# Patient Record
Sex: Female | Born: 1955 | Race: White | Hispanic: No | Marital: Married | Smoking: Never smoker
Health system: Southern US, Community
[De-identification: ages and names within clinical notes are randomized; demographics above are authoritative.]

## PROBLEM LIST (undated history)

## (undated) DIAGNOSIS — E669 Obesity, unspecified: Secondary | ICD-10-CM

## (undated) DIAGNOSIS — Z91018 Allergy to other foods: Secondary | ICD-10-CM

## (undated) DIAGNOSIS — R600 Localized edema: Secondary | ICD-10-CM

## (undated) DIAGNOSIS — K219 Gastro-esophageal reflux disease without esophagitis: Secondary | ICD-10-CM

## (undated) DIAGNOSIS — I10 Essential (primary) hypertension: Secondary | ICD-10-CM

## (undated) DIAGNOSIS — E785 Hyperlipidemia, unspecified: Secondary | ICD-10-CM

## (undated) DIAGNOSIS — K829 Disease of gallbladder, unspecified: Secondary | ICD-10-CM

## (undated) DIAGNOSIS — M2550 Pain in unspecified joint: Secondary | ICD-10-CM

## (undated) HISTORY — PX: APPENDECTOMY: SHX54

## (undated) HISTORY — PX: HERNIA REPAIR: SHX51

## (undated) HISTORY — DX: Disease of gallbladder, unspecified: K82.9

## (undated) HISTORY — DX: Localized edema: R60.0

## (undated) HISTORY — PX: LAPAROSCOPIC CHOLECYSTECTOMY: SUR755

## (undated) HISTORY — DX: Essential (primary) hypertension: I10

## (undated) HISTORY — DX: Hyperlipidemia, unspecified: E78.5

## (undated) HISTORY — DX: Gastro-esophageal reflux disease without esophagitis: K21.9

## (undated) HISTORY — DX: Allergy to other foods: Z91.018

## (undated) HISTORY — PX: POSTERIOR TIBIAL TENDON REPAIR: SHX6039

## (undated) HISTORY — DX: Obesity, unspecified: E66.9

## (undated) HISTORY — DX: Pain in unspecified joint: M25.50

---

## 1981-10-21 HISTORY — PX: TONSILECTOMY, ADENOIDECTOMY, BILATERAL MYRINGOTOMY AND TUBES: SHX2538

## 2000-05-16 ENCOUNTER — Encounter: Admission: RE | Admit: 2000-05-16 | Discharge: 2000-05-16 | Payer: Self-pay | Admitting: Obstetrics and Gynecology

## 2000-05-16 ENCOUNTER — Encounter: Payer: Self-pay | Admitting: Obstetrics and Gynecology

## 2001-05-21 ENCOUNTER — Encounter: Admission: RE | Admit: 2001-05-21 | Discharge: 2001-05-21 | Payer: Self-pay | Admitting: Family Medicine

## 2001-05-21 ENCOUNTER — Encounter: Payer: Self-pay | Admitting: Family Medicine

## 2002-05-28 ENCOUNTER — Encounter: Admission: RE | Admit: 2002-05-28 | Discharge: 2002-05-28 | Payer: Self-pay | Admitting: Obstetrics and Gynecology

## 2002-05-28 ENCOUNTER — Encounter: Payer: Self-pay | Admitting: Obstetrics and Gynecology

## 2003-06-14 ENCOUNTER — Encounter: Payer: Self-pay | Admitting: Obstetrics and Gynecology

## 2003-06-14 ENCOUNTER — Encounter: Admission: RE | Admit: 2003-06-14 | Discharge: 2003-06-14 | Payer: Self-pay | Admitting: Obstetrics and Gynecology

## 2004-06-21 ENCOUNTER — Encounter: Admission: RE | Admit: 2004-06-21 | Discharge: 2004-06-21 | Payer: Self-pay | Admitting: Obstetrics and Gynecology

## 2004-06-28 ENCOUNTER — Encounter: Admission: RE | Admit: 2004-06-28 | Discharge: 2004-06-28 | Payer: Self-pay | Admitting: Obstetrics and Gynecology

## 2004-12-21 ENCOUNTER — Encounter: Admission: RE | Admit: 2004-12-21 | Discharge: 2004-12-21 | Payer: Self-pay | Admitting: Obstetrics and Gynecology

## 2005-06-25 ENCOUNTER — Encounter: Admission: RE | Admit: 2005-06-25 | Discharge: 2005-06-25 | Payer: Self-pay | Admitting: Obstetrics and Gynecology

## 2005-09-15 ENCOUNTER — Emergency Department (HOSPITAL_COMMUNITY): Admission: EM | Admit: 2005-09-15 | Discharge: 2005-09-15 | Payer: Self-pay | Admitting: Emergency Medicine

## 2006-02-26 ENCOUNTER — Ambulatory Visit (HOSPITAL_COMMUNITY): Admission: RE | Admit: 2006-02-26 | Discharge: 2006-02-26 | Payer: Self-pay | Admitting: Neurosurgery

## 2006-07-08 ENCOUNTER — Encounter: Admission: RE | Admit: 2006-07-08 | Discharge: 2006-07-08 | Payer: Self-pay | Admitting: Obstetrics and Gynecology

## 2007-05-29 ENCOUNTER — Ambulatory Visit (HOSPITAL_COMMUNITY): Admission: RE | Admit: 2007-05-29 | Discharge: 2007-05-29 | Payer: Self-pay | Admitting: Family Medicine

## 2007-07-13 ENCOUNTER — Encounter: Admission: RE | Admit: 2007-07-13 | Discharge: 2007-07-13 | Payer: Self-pay | Admitting: Obstetrics and Gynecology

## 2007-07-20 ENCOUNTER — Ambulatory Visit (HOSPITAL_COMMUNITY): Admission: RE | Admit: 2007-07-20 | Discharge: 2007-07-20 | Payer: Self-pay | Admitting: Family Medicine

## 2007-10-12 ENCOUNTER — Inpatient Hospital Stay (HOSPITAL_COMMUNITY): Admission: EM | Admit: 2007-10-12 | Discharge: 2007-10-14 | Payer: Self-pay | Admitting: Emergency Medicine

## 2007-10-13 ENCOUNTER — Ambulatory Visit (HOSPITAL_COMMUNITY): Admission: RE | Admit: 2007-10-13 | Discharge: 2007-10-13 | Payer: Self-pay | Admitting: Family Medicine

## 2008-05-24 ENCOUNTER — Ambulatory Visit (HOSPITAL_COMMUNITY): Admission: RE | Admit: 2008-05-24 | Discharge: 2008-05-24 | Payer: Self-pay | Admitting: Neurosurgery

## 2008-06-15 ENCOUNTER — Encounter: Admission: RE | Admit: 2008-06-15 | Discharge: 2008-07-06 | Payer: Self-pay | Admitting: Orthopedic Surgery

## 2008-08-05 ENCOUNTER — Ambulatory Visit (HOSPITAL_BASED_OUTPATIENT_CLINIC_OR_DEPARTMENT_OTHER): Admission: RE | Admit: 2008-08-05 | Discharge: 2008-08-05 | Payer: Self-pay | Admitting: Obstetrics and Gynecology

## 2009-08-17 ENCOUNTER — Encounter: Admission: RE | Admit: 2009-08-17 | Discharge: 2009-08-17 | Payer: Self-pay | Admitting: Obstetrics and Gynecology

## 2009-08-24 ENCOUNTER — Ambulatory Visit (HOSPITAL_COMMUNITY): Admission: RE | Admit: 2009-08-24 | Discharge: 2009-08-24 | Payer: Self-pay | Admitting: Neurology

## 2009-09-18 ENCOUNTER — Encounter: Admission: RE | Admit: 2009-09-18 | Discharge: 2009-09-26 | Payer: Self-pay | Admitting: Orthopedic Surgery

## 2009-09-27 ENCOUNTER — Ambulatory Visit (HOSPITAL_BASED_OUTPATIENT_CLINIC_OR_DEPARTMENT_OTHER): Admission: RE | Admit: 2009-09-27 | Discharge: 2009-09-28 | Payer: Self-pay | Admitting: Orthopedic Surgery

## 2009-11-08 ENCOUNTER — Encounter: Admission: RE | Admit: 2009-11-08 | Discharge: 2010-02-02 | Payer: Self-pay | Admitting: Orthopedic Surgery

## 2009-12-12 ENCOUNTER — Emergency Department (HOSPITAL_COMMUNITY): Admission: EM | Admit: 2009-12-12 | Discharge: 2009-12-12 | Payer: Self-pay | Admitting: Emergency Medicine

## 2010-07-03 ENCOUNTER — Ambulatory Visit: Payer: Self-pay | Admitting: Family Medicine

## 2010-08-21 ENCOUNTER — Encounter: Admission: RE | Admit: 2010-08-21 | Discharge: 2010-08-21 | Payer: Self-pay | Admitting: Obstetrics and Gynecology

## 2010-11-11 ENCOUNTER — Encounter: Payer: Self-pay | Admitting: Obstetrics and Gynecology

## 2010-11-11 ENCOUNTER — Encounter: Payer: Self-pay | Admitting: Family Medicine

## 2011-01-09 LAB — POCT RAPID STREP A (OFFICE): Streptococcus, Group A Screen (Direct): NEGATIVE

## 2011-01-22 LAB — BASIC METABOLIC PANEL
BUN: 11 mg/dL (ref 6–23)
Chloride: 106 mEq/L (ref 96–112)
Glucose, Bld: 100 mg/dL — ABNORMAL HIGH (ref 70–99)
Potassium: 4 mEq/L (ref 3.5–5.1)

## 2011-03-05 NOTE — Op Note (Signed)
Andrea [name], [name]                 ACCOUNT NO.:  [id number]   MEDICAL RECORD NO.:  [id number]          PATIENT TYPE:  INP   LOCATION:  5034                         FACILITY:  [facility]   PHYSICIAN:  Lennie Muckle, MD      DATE OF BIRTH:  Nov 04, 1955   DATE OF PROCEDURE:  10/13/2007  DATE OF DISCHARGE:                               OPERATIVE REPORT   DIAGNOSIS:  Incisional hernia.   PROCEDURE:  Laparoscopic incisional hernia repair.   SURGEON:  Lennie Muckle, MD   ASSISTANT:  Drue Dun, M.D.   ANESTHESIA:  General endotracheal anesthesia.   No immediate complications.  No drains were placed.  A 15 x 20 piece of  dual mesh was placed in the abdominal cavity.   ESTIMATED BLOOD LOSS:  Less than 25 mL.   INDICATIONS FOR PROCEDURE:  Andrea [name] is a 55 year old female who came  to the emergency room last night due to abdominal pain and irretractible  nausea and vomiting.  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  She  did have a previous appendectomy, which the hernia site was directly  over the incisional scar.  Thus, I believed it to be incisional hernia.  Due to her pain, and nausea and vomiting, and the findings of the  intestines within the hernia, discussed with the patient to perform a  hernia repair at the time of her hospitalization.   DETAILS OF PROCEDURE:  Andrea [name] was identified in the preoperative  holding suite.  She was given 2 gm of Kefzol, taken to the operating  room.  Once in the operating room she was placed in a supine position.  After administration of general endotracheal anesthesia, a Foley  catheter was placed.  Her abdomen was prepped and draped in the usual  sterile fashion.  Time out procedure indicated the patient and procedure  were performed.   Using a Veress needle, pneumoperitoneum was able to be obtained.  After  the pneumo instillation, a 5-mm trocar was placed just to the  left of  the umbilicus.  Using the Optiview, the camera was placed into the  abdominal cavity.  There was no evidence of injury with the Veress  needle or the trocar.  An additional 5-mm trocar was placed in the left  upper quadrant.  There were noted multiple adhesions, one in the midline  with omentum, and multiple adhesions in the right lower quadrant with  loops of intestine adhered to the abdominal wall.  Using laparoscopic  scissors, the adhesions were taken down sharply.  Care was to avoid  injuring the intestines.  I was able to visualize a hernia defect within  the abdominal wall in the right lower quadrant.  After removing the  intestinal adhesions, there was a small amount of epiploic fat stuck  within a second smaller hernia site.  This was able to be fully reduced.   The hernia defect size was noted to be approximately 5 cm.  Putting 4 cm  on  either side, I elected to place the 15 x 20 piece of dual mesh.  The  Parietex mesh was marked with 2-0 Prolenes on 4 sides.  It was then  moistened and placed inside the abdominal cavity.  The mesh was then  secured to the abdominal wall with adequate tension.  Using the Pro  tacker device it was then secured all the way around to the peritoneum.  Five additional pieces of Gore-Tex suture were used to fully secure the  mesh to the abdominal wall.  An additional 5-mm trocar was placed in the  left lower quadrant.  The 11-mm trocar was closed with 0 Vicryl suture.  Skin was closed with 4-0 Monocryl.  Steri-Strips were placed for final  dressing over the stab incisions for the suture, and as well, over the  trocar sites.   The patient was then extubated and transported to the post anesthesia  care unit in stable condition.      Lennie Muckle, MD  Electronically Signed     ALA/MEDQ  D:  10/13/2007  T:  10/14/2007  Job:  [id number]

## 2011-03-05 NOTE — Consult Note (Signed)
Andrea [name], Andrea [name]                 ACCOUNT NO.:  [id number]   MEDICAL RECORD NO.:  [id number]           PATIENT TYPE:   LOCATION:                                 FACILITY:   PHYSICIAN:  Clovis Pu. Cornett, M.D.DATE OF BIRTH:  1956/09/21   DATE OF CONSULTATION:  DATE OF DISCHARGE:                                 CONSULTATION   PHYSICIAN REQUESTING CONSULTATION:  Dr. Mariel Aloe.   REASON FOR CONSULTATION:  Nausea, vomiting, abdominal pain.   HISTORY OF PRESENT ILLNESS:  The patient is a 55 year old female with a  33-month history of intermittent epigastric pain, nausea and vomiting.  She has been worked up by her primary care doctor, and actually has seen  Dr. 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  She has had intermittent nausea, vomiting, and  epigastric pain off and on over the last 2-88-months.  Last week, she  developed an increase in her nausea, vomiting and epigastric pain.  This  let off over the weekend, and it increased again today.  Her chief  complaint was epigastric pain with nausea/vomiting.  Her bowel function  has been relatively normal.  Her pain became quite severe.  She sought  care in the emergency room.  CT scan revealed the right lower quadrant  spigelian hernia with part of what appeared to be the ascending colon up  into it.  There is no free fluid or swelling that I could see.  There  was some dilatation of the small bowel consistent with partial small-  bowel obstruction.  Currently, her pain is gone.  She received some  Dilaudid earlier.  She denies any abdominal pain currently, and is  having no nausea or vomiting currently.  When the pain did occur, it  actually occurred in her epigastrium.  She has never had any significant  right lower quadrant pain today to speak of she states.  No blood in her  stool.   PAST MEDICAL HISTORY:  1.  Essential hypertension.  2. History of diverticulitis.   PAST SURGICAL HISTORY:  Laparoscopic cholecystectomy.   MEDICATIONS:  Cymbalta, Diovan, Levsin, Nexium.   ALLERGIES:  None.   FAMILY HISTORY:  Noncontributory.   REVIEW OF SYSTEMS:  15-point review of systems as above, otherwise  negative x15.   SOCIAL HISTORY:  Denies tobacco or alcohol use.  She is married.  She is  a Engineer, civil (consulting).   PHYSICAL EXAM:  Temperature 97, pulse 88, blood pressure 100/70.  GENERAL APPEARANCE:  Pleasant female in no apparent distress.  HEENT:  Extraocular movements are intact.  No evidence of scleral  icterus.  NECK:  Supple, nontender.  Trachea midline.  No mass.  PULMONARY:  Lungs are clear to auscultation.  CHEST WALL:  Motion normal.  CARDIOVASCULAR:  Regular rate and rhythm without rub, murmur or gallop.  ABDOMEN is soft, nontender without rebound, guarding.  Right lower  quadrant hernia can be palpated.  It is nontender to deep palpation.  It  is not hard and feels reducible currently.  EXTREMITIES:  Muscle tone normal.  Range of motion normal.  NEUROLOGICAL EXAMINATION:  Motor and sensory function grossly intact.  Glasgow coma scale is 15.   DIAGNOSTIC STUDIES:  I reviewed her CT scan of the abdomen and pelvis.  Right lower quadrant spigelian hernia with what appears to be ascending  colon within it.  No free fluid or obvious signs of free air.  She has a  white count of 11,000.  No left shift.  Urinalysis shows 80+ ketones.  Hemoglobin is 13.  Sodium 137, potassium 2.8, chloride 103, CO2 22, BUN  8, creatinine 0.6, glucose 116.   IMPRESSION:  1. Spigelian hernia, symptomatic.  Currently no signs of incarceration      by physical examination, and probable reduction with physical exam.  2. Hypokalemia.   PLAN:  We will admit for IV fluids and replacement of potassium.  She  will need to have this repaired since she is having more symptoms from  this, and we will make arrangements for this  to be done by the doctor of  the week, who is Dr. Bertram Savin.  I have talked to the patient about  this, and this can be approached laparoscopically.  She understands the  above and agrees to be admitted for hydration and correction of her  potassium prior to surgery.      Thomas A. Cornett, M.D.  Electronically Signed     TAC/MEDQ  D:  10/12/2007  T:  10/13/2007  Job:  [id number]   cc:   Sheppard Penton. Stacie Acres, M.D.

## 2011-03-05 NOTE — Discharge Summary (Signed)
Andrea [name], ZAKRZEWSKI                 ACCOUNT NO.:  [id number]   MEDICAL RECORD NO.:  [id number]          PATIENT TYPE:  INP   LOCATION:  5034                         FACILITY:  [facility]   PHYSICIAN:  Lennie Muckle, MD      DATE OF BIRTH:  03-Dec-1955   DATE OF ADMISSION:  10/12/2007  DATE OF DISCHARGE:  10/14/2007                               DISCHARGE SUMMARY   Final/discharge diagnosis:  Incisional hernia.   PROCEDURE:  Laparoscopic incisional hernia repair on October 13, 2007.   HOSPITAL COURSE:  Mrs. Vernet was admitted on October 12, 2007 due to  abdominal pain and nausea and vomiting.  CT scan revealed a hernia in  the right lower quadrant.  She had a previous appendectomy and more than  likely it was related to this.  Due to her recurrent symptoms and her  nausea and vomiting, I elected to perform a repair during her  hospitalization.  She was taken to the operating room on October 13, 2007 and had an uneventful laparoscopic incisional hernia repair.  Her  pain postoperatively is well-controlled with Percocet, she is tolerating  a regular diet.   DISCHARGE INSTRUCTIONS:  She is discharged with instructions to perform  no heavy lifting greater than 20 pounds for 4 weeks.  She will follow-up  with Dr. Freida Busman in approximately 2-3 weeks.  She will return to work when  she feels better, more than likely return next Monday.   DISCHARGE MEDICATIONS INCLUDE:  1. Percocet 5/325 mg 1-2 every 4 hours p.r.n. for pain.  2. She may take Tylenol instead of the Percocet, and may take over-the-      counter ibuprofen to aid with her pain as well.      Lennie Muckle, MD  Electronically Signed     ALA/MEDQ  D:  10/14/2007  T:  10/14/2007  Job:  [id number]   cc:   Maisie Fus A. Cornett, M.D.

## 2011-07-26 LAB — COMPREHENSIVE METABOLIC PANEL
ALT: 26
Albumin: 3 — ABNORMAL LOW
Albumin: 4
Alkaline Phosphatase: 53
BUN: 4 — ABNORMAL LOW
Calcium: 9.6
Chloride: 103
Glucose, Bld: 116 — ABNORMAL HIGH
Glucose, Bld: 91
Potassium: 2.6 — CL
Sodium: 137
Total Bilirubin: 0.9
Total Protein: 7.2

## 2011-07-26 LAB — CBC
HCT: 33.5 — ABNORMAL LOW
HCT: 40.1
Hemoglobin: 11.4 — ABNORMAL LOW
Hemoglobin: 13.6
MCHC: 34
MCV: 88.1
RBC: 3.8 — ABNORMAL LOW
RDW: 13.5
WBC: 8.5

## 2011-07-26 LAB — URINALYSIS, ROUTINE W REFLEX MICROSCOPIC
Glucose, UA: NEGATIVE
pH: 6.5

## 2011-07-26 LAB — DIFFERENTIAL
Eosinophils Absolute: 0.1 — ABNORMAL LOW
Lymphs Abs: 2.6
Monocytes Relative: 7
Neutro Abs: 7.4
Neutrophils Relative %: 67

## 2011-07-26 LAB — POTASSIUM: Potassium: 3.4 — ABNORMAL LOW

## 2011-08-02 ENCOUNTER — Other Ambulatory Visit: Payer: Self-pay | Admitting: Physician Assistant

## 2011-08-02 DIAGNOSIS — Z1231 Encounter for screening mammogram for malignant neoplasm of breast: Secondary | ICD-10-CM

## 2011-08-13 ENCOUNTER — Other Ambulatory Visit: Payer: Self-pay | Admitting: Physician Assistant

## 2011-08-13 ENCOUNTER — Other Ambulatory Visit (HOSPITAL_COMMUNITY)
Admission: RE | Admit: 2011-08-13 | Discharge: 2011-08-13 | Disposition: A | Discharge status: Home / Self Care | Payer: Commercial Managed Care - PPO | Source: Ambulatory Visit | Attending: Family Medicine | Admitting: Family Medicine

## 2011-08-13 DIAGNOSIS — Z01419 Encounter for gynecological examination (general) (routine) without abnormal findings: Secondary | ICD-10-CM | POA: Insufficient documentation

## 2011-08-28 ENCOUNTER — Ambulatory Visit
Admission: RE | Admit: 2011-08-28 | Discharge: 2011-08-28 | Disposition: A | Discharge status: Home / Self Care | Payer: Commercial Managed Care - PPO | Source: Ambulatory Visit | Attending: Physician Assistant | Admitting: Physician Assistant

## 2011-08-28 DIAGNOSIS — Z1231 Encounter for screening mammogram for malignant neoplasm of breast: Secondary | ICD-10-CM

## 2012-08-21 ENCOUNTER — Other Ambulatory Visit: Payer: Self-pay | Admitting: Physician Assistant

## 2012-08-21 DIAGNOSIS — Z1231 Encounter for screening mammogram for malignant neoplasm of breast: Secondary | ICD-10-CM

## 2012-09-21 ENCOUNTER — Other Ambulatory Visit (HOSPITAL_COMMUNITY)
Admission: RE | Admit: 2012-09-21 | Discharge: 2012-09-21 | Disposition: A | Discharge status: Home / Self Care | Payer: 59 | Source: Ambulatory Visit | Attending: Family Medicine | Admitting: Family Medicine

## 2012-09-21 ENCOUNTER — Other Ambulatory Visit: Payer: Self-pay | Admitting: Physician Assistant

## 2012-09-21 DIAGNOSIS — Z124 Encounter for screening for malignant neoplasm of cervix: Secondary | ICD-10-CM | POA: Insufficient documentation

## 2012-09-28 ENCOUNTER — Ambulatory Visit: Payer: Commercial Managed Care - PPO

## 2012-10-23 ENCOUNTER — Ambulatory Visit
Admission: RE | Admit: 2012-10-23 | Discharge: 2012-10-23 | Disposition: A | Discharge status: Home / Self Care | Payer: 59 | Source: Ambulatory Visit | Attending: Physician Assistant | Admitting: Physician Assistant

## 2012-10-23 DIAGNOSIS — Z1231 Encounter for screening mammogram for malignant neoplasm of breast: Secondary | ICD-10-CM

## 2012-11-19 ENCOUNTER — Encounter: Payer: 59 | Attending: Physician Assistant | Admitting: *Deleted

## 2012-11-19 ENCOUNTER — Encounter: Payer: Self-pay | Admitting: *Deleted

## 2012-11-19 DIAGNOSIS — I10 Essential (primary) hypertension: Secondary | ICD-10-CM | POA: Insufficient documentation

## 2012-11-19 DIAGNOSIS — E785 Hyperlipidemia, unspecified: Secondary | ICD-10-CM | POA: Insufficient documentation

## 2012-11-19 DIAGNOSIS — Z713 Dietary counseling and surveillance: Secondary | ICD-10-CM | POA: Insufficient documentation

## 2012-11-19 NOTE — Progress Notes (Signed)
  Medical Nutrition Therapy:  Appt start time: 1500 end time:  1600.  Assessment:  Primary concerns today: patient is self referred for HTN and Hyperlipidemia. 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 Enjoys 7 grandchildren locally, works with church and enjoys reading. Goes to gym twice a week walks 30+ minutes and weights. Mother had a stroke within the past year and she is concerned about her risk for same.  MEDICATIONS: see list   DIETARY INTAKE:  Usual eating pattern includes 3 meals and 1-2 snacks per day.  Everyday foods include good variety of all food groups.  Avoided foods include coffee, tea, any sodas.    24-hr recall:  B ( AM): instant flavored oatmeal OR cheerios with Skim milk, OR egg with 2 slices light wheat bread, oleo, water to drink  Snk ( AM): occasionally fresh fruit  L ( PM): brings from home: sandwich on light bread or sandwich thin, OR salad OR Weight Watcher frozen dinner OR chicken and veggies from cafe, water Snk ( PM): occasionally a fat free yogurt OR fresh fruit OR peppers OR hummus D ( PM): lean meat, starch, vegetables or salad with fat free dressing, water Snk ( PM): popcorn OR Skinny Cow ice cream sandwich on occasion Beverages: water and occasionally some fruit juice  Usual physical activity: Goes to gym twice a week walks 30+ minutes and weights.  Estimated energy needs: 1400 calories 158 g carbohydrates 105 g protein 39 g fat  Progress Towards Goal(s):  In progress.   Nutritional Diagnosis:  NB-1.1 Food and nutrition-related knowledge deficit As related to hypercholesterolemia.  As evidenced by elevated cholesterol levels.    Intervention:  Nutrition counseling for weight management and hypercholesteroemia initiated. She is familiar with avoiding high animal fat foods but would like more guidance as to other foods to include or  avoid. She is successful with Weight Watchers and reports a loss of 48 pounds in past year. I offered more information on Carb Counting, saturated vs. Unsaturated fat sources and reading food labels. Encouraged her to increase the frequency of going to the gym as able. Plan: Continue reading food labels for Total Carbohydrate and Fat Grams of foods Consider limiting Saturated Fat on label to less than 50% of Total Fat grams Consider  increasing your activity level by going to the gym for 60 minutes 3 days a week or more as tolerated  Handouts given during visit include:  Carb Counting Book by [employer] with excellent fat breakdown information  Food Label handout with explanation of Saturated, Trans, Poly and Monounsaturated Fat   APP information available for cell phone  Monitoring/Evaluation:  Dietary intake, exercise, reading food labels, and body weight prn.

## 2012-11-19 NOTE — Patient Instructions (Addendum)
Plan: Continue reading food labels for Total Carbohydrate and Fat Grams of foods Consider limiting Saturated Fat on label to less than 50% of Total Fat grams Consider  increasing your activity level by going to the gym for 60 minutes 3 days a week or more as tolerated

## 2013-03-18 ENCOUNTER — Other Ambulatory Visit (HOSPITAL_COMMUNITY): Payer: Self-pay | Admitting: Neurosurgery

## 2013-03-18 DIAGNOSIS — M5126 Other intervertebral disc displacement, lumbar region: Secondary | ICD-10-CM

## 2013-03-22 ENCOUNTER — Ambulatory Visit (HOSPITAL_COMMUNITY)
Admission: RE | Admit: 2013-03-22 | Discharge: 2013-03-22 | Disposition: A | Discharge status: Home / Self Care | Payer: 59 | Source: Ambulatory Visit | Attending: Neurosurgery | Admitting: Neurosurgery

## 2013-03-22 DIAGNOSIS — M5126 Other intervertebral disc displacement, lumbar region: Secondary | ICD-10-CM

## 2013-03-22 DIAGNOSIS — Q619 Cystic kidney disease, unspecified: Secondary | ICD-10-CM | POA: Insufficient documentation

## 2013-03-22 DIAGNOSIS — M5144 Schmorl's nodes, thoracic region: Secondary | ICD-10-CM | POA: Insufficient documentation

## 2013-03-22 DIAGNOSIS — IMO0002 Reserved for concepts with insufficient information to code with codable children: Secondary | ICD-10-CM | POA: Insufficient documentation

## 2013-03-22 DIAGNOSIS — M5124 Other intervertebral disc displacement, thoracic region: Secondary | ICD-10-CM | POA: Insufficient documentation

## 2013-03-30 ENCOUNTER — Ambulatory Visit: Payer: 59 | Attending: Neurosurgery | Admitting: Physical Therapy

## 2013-03-30 DIAGNOSIS — M25559 Pain in unspecified hip: Secondary | ICD-10-CM | POA: Insufficient documentation

## 2013-03-30 DIAGNOSIS — M545 Low back pain, unspecified: Secondary | ICD-10-CM | POA: Insufficient documentation

## 2013-03-30 DIAGNOSIS — IMO0001 Reserved for inherently not codable concepts without codable children: Secondary | ICD-10-CM | POA: Insufficient documentation

## 2013-04-06 ENCOUNTER — Ambulatory Visit: Payer: 59 | Admitting: Rehabilitation

## 2013-04-08 ENCOUNTER — Ambulatory Visit: Payer: 59 | Admitting: Rehabilitation

## 2013-04-12 ENCOUNTER — Ambulatory Visit: Payer: 59 | Admitting: Physical Therapy

## 2013-04-15 ENCOUNTER — Ambulatory Visit: Payer: 59 | Admitting: Rehabilitation

## 2013-04-19 ENCOUNTER — Ambulatory Visit: Payer: 59 | Admitting: Physical Therapy

## 2013-04-21 ENCOUNTER — Ambulatory Visit: Payer: 59 | Attending: Neurosurgery | Admitting: Physical Therapy

## 2013-04-21 DIAGNOSIS — M545 Low back pain, unspecified: Secondary | ICD-10-CM | POA: Insufficient documentation

## 2013-04-21 DIAGNOSIS — IMO0001 Reserved for inherently not codable concepts without codable children: Secondary | ICD-10-CM | POA: Insufficient documentation

## 2013-04-21 DIAGNOSIS — M25559 Pain in unspecified hip: Secondary | ICD-10-CM | POA: Insufficient documentation

## 2013-04-26 ENCOUNTER — Ambulatory Visit: Payer: 59 | Admitting: Physical Therapy

## 2013-04-29 ENCOUNTER — Ambulatory Visit: Payer: 59 | Admitting: Rehabilitation

## 2013-08-26 ENCOUNTER — Other Ambulatory Visit: Payer: Self-pay

## 2013-09-21 ENCOUNTER — Other Ambulatory Visit: Payer: Self-pay

## 2013-09-21 DIAGNOSIS — Z1231 Encounter for screening mammogram for malignant neoplasm of breast: Secondary | ICD-10-CM

## 2013-09-27 ENCOUNTER — Other Ambulatory Visit (HOSPITAL_COMMUNITY)
Admission: RE | Admit: 2013-09-27 | Discharge: 2013-09-27 | Disposition: A | Discharge status: Home / Self Care | Payer: 59 | Source: Ambulatory Visit | Attending: Family Medicine | Admitting: Family Medicine

## 2013-09-27 ENCOUNTER — Other Ambulatory Visit: Payer: Self-pay | Admitting: Physician Assistant

## 2013-09-27 DIAGNOSIS — Z124 Encounter for screening for malignant neoplasm of cervix: Secondary | ICD-10-CM | POA: Insufficient documentation

## 2013-10-25 ENCOUNTER — Ambulatory Visit
Admission: RE | Admit: 2013-10-25 | Discharge: 2013-10-25 | Disposition: A | Discharge status: Home / Self Care | Payer: 59 | Source: Ambulatory Visit

## 2013-10-25 DIAGNOSIS — Z1231 Encounter for screening mammogram for malignant neoplasm of breast: Secondary | ICD-10-CM

## 2013-12-14 ENCOUNTER — Ambulatory Visit (INDEPENDENT_AMBULATORY_CARE_PROVIDER_SITE_OTHER): Payer: Self-pay | Admitting: Family Medicine

## 2013-12-14 DIAGNOSIS — Z713 Dietary counseling and surveillance: Secondary | ICD-10-CM

## 2014-09-17 ENCOUNTER — Telehealth: Payer: Self-pay | Admitting: Nurse Practitioner

## 2014-09-17 DIAGNOSIS — M545 Low back pain, unspecified: Secondary | ICD-10-CM

## 2014-09-17 MED ORDER — NAPROXEN 500 MG PO TABS: 500.0000 mg | ORAL_TABLET | Freq: Two times a day (BID) | ORAL | Status: DC

## 2014-09-17 MED ORDER — CYCLOBENZAPRINE HCL 10 MG PO TABS: 10.0000 mg | ORAL_TABLET | Freq: Three times a day (TID) | ORAL | Status: DC | PRN

## 2014-09-17 NOTE — Progress Notes (Signed)
We are sorry that you are not feeling well.  Here is how we plan to help!  Based on what you have shared with me it looks like you mostly have acute back pain.  Acute back pain is defined as musculoskeletal pain that can resolve in 1-3 weeks with conservative treatment.  I have prescribed Naprosyn 500 mg twice a day non-steroid anti-inflammatory (NSAID) as well as Flexeril 10 mg every eight hours as needed which is a muscle relaxer.  Some patients experience stomach irritation or in increased heartburn with anti-inflammatory drugs.  Please keep in mind that muscle relaxer's can cause fatigue and should not be taken while at work or driving.  Back pain is very common.  The pain often gets better over time.  The cause of back pain is usually not dangerous.  Most people can learn to manage their back pain on their own.  Home Care  Stay active.  Start with short walks on flat ground if you can.  Try to walk farther each day.  Do not sit, drive or stand in one place for more than 30 minutes.  Do not stay in bed.  Do not avoid exercise or work.  Activity can help your back heal faster.  Be careful when you bend or lift an object.  Bend at your knees, keep the object close to you, and do not twist.  Sleep on a firm mattress.  Lie on your side, and bend your knees.  If you lie on your back, put a pillow under your knees.  Only take medicines as told by your doctor.  Put ice on the injured area.  Put ice in a plastic bag  Place a towel between your skin and the bag  Leave the ice on for 15-20 minutes, 3-4 times a day for the first 2-3 days.  After that, you can switch between ice and heat packs.  Ask your doctor about back exercises or massage.  Avoid feeling anxious or stressed.  Find good ways to deal with stress, such as exercise.  Get Help Right Way If:  Your pain does not go away with rest or medicine.  Your pain does not go away in 1 week.  You have new problems.  You do not  feel well.  The pain spreads into your legs.  You cannot control when you poop (bowel movement) or pee (urinate)  You feel sick to your stomach (nauseous) or throw up (vomit)  You have belly (abdominal) pain.  You feel like you may pass out (faint).  If you develop a fever.  Make Sure you:  Understand these instructions.  Will watch your condition  Will get help right away if you are not doing well or get worse.  Your e-visit answers were reviewed by a board certified advanced clinical practitioner to complete your personal care plan.  Depending on the condition, your plan could have included both over the counter or prescription medications.  Please review your pharmacy choice.  If there is a problem, you may call our nursing hot line at [phone] and have the prescription routed to another pharmacy.  Your safety is important to [place].  If you have drug allergies check your prescription carefully.    You can use MyChart to ask questions about today's visit, request a non-urgent call back, or ask for a work or school excuse.  You will get an e-mail in the next two days asking about your experience.  I hope that your  e-visit has been valuable and will speed your recovery. Thank you for using e-visits.

## 2014-09-28 ENCOUNTER — Other Ambulatory Visit: Payer: Self-pay | Admitting: Physician Assistant

## 2014-09-28 ENCOUNTER — Other Ambulatory Visit (HOSPITAL_COMMUNITY)
Admission: RE | Admit: 2014-09-28 | Discharge: 2014-09-28 | Disposition: A | Discharge status: Home / Self Care | Payer: 59 | Source: Ambulatory Visit | Attending: Family Medicine | Admitting: Family Medicine

## 2014-09-28 DIAGNOSIS — Z124 Encounter for screening for malignant neoplasm of cervix: Secondary | ICD-10-CM | POA: Insufficient documentation

## 2014-09-29 LAB — CYTOLOGY - PAP

## 2014-10-06 ENCOUNTER — Other Ambulatory Visit: Payer: Self-pay

## 2014-10-06 DIAGNOSIS — Z1231 Encounter for screening mammogram for malignant neoplasm of breast: Secondary | ICD-10-CM

## 2014-10-27 ENCOUNTER — Ambulatory Visit
Admission: RE | Admit: 2014-10-27 | Discharge: 2014-10-27 | Disposition: A | Discharge status: Home / Self Care | Payer: 59 | Source: Ambulatory Visit

## 2014-10-27 DIAGNOSIS — Z1231 Encounter for screening mammogram for malignant neoplasm of breast: Secondary | ICD-10-CM

## 2015-01-09 ENCOUNTER — Other Ambulatory Visit: Payer: Self-pay | Admitting: Physician Assistant

## 2015-01-10 LAB — CYTOLOGY - PAP

## 2015-10-03 ENCOUNTER — Other Ambulatory Visit: Payer: Self-pay

## 2015-10-03 DIAGNOSIS — Z1231 Encounter for screening mammogram for malignant neoplasm of breast: Secondary | ICD-10-CM

## 2015-10-13 ENCOUNTER — Other Ambulatory Visit: Payer: Self-pay | Admitting: Orthopedic Surgery

## 2015-10-13 ENCOUNTER — Other Ambulatory Visit (HOSPITAL_COMMUNITY): Payer: Self-pay | Admitting: Orthopedic Surgery

## 2015-10-13 DIAGNOSIS — M25551 Pain in right hip: Secondary | ICD-10-CM

## 2015-10-17 ENCOUNTER — Other Ambulatory Visit (HOSPITAL_COMMUNITY): Payer: Self-pay | Admitting: Orthopedic Surgery

## 2015-10-17 ENCOUNTER — Ambulatory Visit (HOSPITAL_COMMUNITY): Payer: 59

## 2015-10-17 DIAGNOSIS — M25551 Pain in right hip: Secondary | ICD-10-CM

## 2015-10-27 ENCOUNTER — Ambulatory Visit (HOSPITAL_COMMUNITY)
Admission: RE | Admit: 2015-10-27 | Discharge: 2015-10-27 | Disposition: A | Discharge status: Home / Self Care | Payer: 59 | Source: Ambulatory Visit | Attending: Orthopedic Surgery | Admitting: Orthopedic Surgery

## 2015-10-27 DIAGNOSIS — M25551 Pain in right hip: Secondary | ICD-10-CM | POA: Insufficient documentation

## 2015-10-27 DIAGNOSIS — M67853 Other specified disorders of tendon, right hip: Secondary | ICD-10-CM | POA: Diagnosis not present

## 2015-10-27 DIAGNOSIS — M7601 Gluteal tendinitis, right hip: Secondary | ICD-10-CM | POA: Diagnosis not present

## 2015-10-30 ENCOUNTER — Ambulatory Visit
Admission: RE | Admit: 2015-10-30 | Discharge: 2015-10-30 | Disposition: A | Discharge status: Home / Self Care | Payer: 59 | Source: Ambulatory Visit

## 2015-10-30 DIAGNOSIS — Z1231 Encounter for screening mammogram for malignant neoplasm of breast: Secondary | ICD-10-CM

## 2015-11-14 DIAGNOSIS — M1611 Unilateral primary osteoarthritis, right hip: Secondary | ICD-10-CM | POA: Diagnosis not present

## 2015-11-16 DIAGNOSIS — M1611 Unilateral primary osteoarthritis, right hip: Secondary | ICD-10-CM | POA: Diagnosis not present

## 2015-12-12 DIAGNOSIS — M1611 Unilateral primary osteoarthritis, right hip: Secondary | ICD-10-CM | POA: Diagnosis not present

## 2015-12-12 DIAGNOSIS — M25551 Pain in right hip: Secondary | ICD-10-CM | POA: Diagnosis not present

## 2015-12-19 ENCOUNTER — Encounter: Payer: Self-pay | Admitting: Allergy and Immunology

## 2015-12-19 ENCOUNTER — Ambulatory Visit (INDEPENDENT_AMBULATORY_CARE_PROVIDER_SITE_OTHER): Payer: 59 | Admitting: Allergy and Immunology

## 2015-12-19 VITALS — BP 120/75 | HR 70 | Temp 98.3°F | Resp 16

## 2015-12-19 DIAGNOSIS — L500 Allergic urticaria: Secondary | ICD-10-CM | POA: Diagnosis not present

## 2015-12-19 MED ORDER — PREDNISONE 1 MG PO TABS: 10.0000 mg | ORAL_TABLET | ORAL | Status: DC

## 2015-12-19 MED ORDER — LEVOCETIRIZINE DIHYDROCHLORIDE 5 MG PO TABS: 5.0000 mg | ORAL_TABLET | Freq: Every evening | ORAL | Status: DC

## 2015-12-19 MED FILL — VALSARTAN-HCTZ 80-12.5 MG T: 80-12.5 | 90 days supply | Qty: 90 | Fill #0

## 2015-12-19 MED FILL — LEVOCETIRIZINE 5 MG TABLET: 5 | 30 days supply | Qty: 30 | Fill #0

## 2015-12-19 NOTE — Assessment & Plan Note (Addendum)
   We were unable to perform skin tests today due to recent administration of antihistamine.   The patient is scheduled to return in the next 1-2 weeks for allergy skin testing after having been off of antihistamines for at least 3 days.  To control symptoms while off of antihistamines, prednisone has been provided: 10g per day for 3 days. Further recommendations will be made at that time based upon skin test results.  For symptom relief, instructions have been provided and discussed for H1/H2 receptor blockade with titration to find lowest effective dose.  A prescription has been provided for levocetirizine 5 mg daily as needed.  Should significant symptoms or new symptoms occur, a journal is to be kept recording any foods eaten, beverages consumed, medications taken, activities performed, and environmental conditions within a 6 hour time period prior to the onset of symptoms. For any symptoms concerning for anaphylaxis, 911 is to be called immediately.

## 2015-12-19 NOTE — Patient Instructions (Addendum)
Chronic urticaria  We were unable to perform skin tests today due to recent administration of antihistamine.   The patient is scheduled to return in the next 1-2 weeks for allergy skin testing after having been off of antihistamines for at least 3 days.  To control symptoms while off of antihistamines, prednisone has been provided: 10g per day for 3 days. Further recommendations will be made at that time based upon skin test results.  For symptom relief, instructions have been provided and discussed for H1/H2 receptor blockade with titration to find lowest effective dose.  A prescription has been provided for levocetirizine 5 mg daily as needed.  Should significant symptoms or new symptoms occur, a journal is to be kept recording any foods eaten, beverages consumed, medications taken, activities performed, and environmental conditions within a 6 hour time period prior to the onset of symptoms. For any symptoms concerning for anaphylaxis, 911 is to be called immediately.     Return in about 2 weeks (around 01/02/2016) for allergy skin testing.   Urticaria (Hives)  . Levocetirizine (Xyzal) 5 mg in morning and Cetirizine (Zyrtec) 10mg  at night and ranitidine (Zantac) 150 mg twice a day. If no symptoms for 7-14 days then decrease to. . Levocetirizine (Xyzal) 5 mg in morning and Cetirizine (Zyrtec) 10mg  at night and ranitidine (Zantac) 150 mg once a day.  If no symptoms for 7-14 days then decrease to. . Levocetirizine (Xyzal) 5 mg in morning and Cetirizine (Zyrtec) 10mg  at night.  If no symptoms for 7-14 days then decrease to. . Levocetirizine (Xyzal) 5 mg once a day.  May use Benadryl (diphenhydramine) as needed for breakthrough symptoms       If symptoms return, then step up dosage  Prednisone 10 mg tablet x 3 days IF NEEDED prior to skin test while off of antihistamine.

## 2015-12-19 NOTE — Progress Notes (Signed)
Follow-up Note  RE: Andrea [name] MRN: [id number] DOB: February 08, 1956 Date of Office Visit: 12/19/2015  Primary care provider: REDMON,NOELLE, PA-C Referring provider: Lennie Odor, PA-C  History of present illness: HPI Comments: Andrea [name] is a 60 y.o. female with a history of rash who returns today for follow up.  She was last seen in this office 03/14/2014.  Rodneshia experiences recurrent episodes of hives every 2 or 3 months.  The hives typically occur on her cheeks, chin, and anterior neck.  The hives are described as erythematous, raised, and pruritic.  Individual hives resolve within 24 hours without residual pigmentation or bruising.  She denies concomitant cardiopulmonary or GI symptoms.  The hives respond rapidly to diphenhydramine.  She developed hives the typical location on her face and neck yesterday, however she also developed mild swelling of the lips.  No specific medication, food or environmental triggers have been identified.   Assessment and plan: Chronic urticaria  We were unable to perform skin tests today due to recent administration of antihistamine.   The patient is scheduled to return in the next 1-2 weeks for allergy skin testing after having been off of antihistamines for at least 3 days.  To control symptoms while off of antihistamines, prednisone has been provided: 10g per day for 3 days. Further recommendations will be made at that time based upon skin test results.  For symptom relief, instructions have been provided and discussed for H1/H2 receptor blockade with titration to find lowest effective dose.  A prescription has been provided for levocetirizine 5 mg daily as needed.  Should significant symptoms or new symptoms occur, a journal is to be kept recording any foods eaten, beverages consumed, medications taken, activities performed, and environmental conditions within a 6 hour time period prior to the onset of symptoms. For any symptoms concerning for  anaphylaxis, 911 is to be called immediately.     Meds ordered this encounter  Medications  . levocetirizine (XYZAL) 5 MG tablet    Sig: Take 1 tablet (5 mg total) by mouth every evening.    Dispense:  30 tablet    Refill:  5  . predniSONE (DELTASONE) tablet 10 mg    Sig:     Diagnositics: We were unable to perform skin tests today due to recent administration of antihistamine.     Physical examination: Blood pressure 120/75, pulse 70, temperature 98.3 F (36.8 C), temperature source Oral, resp. rate 16, last menstrual period 05/30/2011.  General: Alert, interactive, in no acute distress. Lungs: Clear to auscultation without wheezing, rhonchi or rales. CV: Normal S1, S2 without murmurs. Skin: Scattered erythematous urticarial type lesions primarily located on the right cheek, chin, and anterior neck , nonvesicular.  The following portions of the patient's history were reviewed and updated as appropriate: allergies, current medications, past family history, past medical history, past social history, past surgical history and problem list.    Medication List       This list is accurate as of: 12/19/15  7:08 PM.  Always use your most recent med list.               cyclobenzaprine 10 MG tablet  Commonly known as:  FLEXERIL  Take 1 tablet (10 mg total) by mouth 3 (three) times daily as needed for muscle spasms.     levocetirizine 5 MG tablet  Commonly known as:  XYZAL  Take 1 tablet (5 mg total) by mouth every evening.     naproxen 500  MG tablet  Commonly known as:  NAPROSYN  Take 1 tablet (500 mg total) by mouth 2 (two) times daily with a meal.     valsartan-hydrochlorothiazide 80-12.5 MG tablet  Commonly known as:  DIOVAN-HCT  Take 1 tablet by mouth daily.        No Known Allergies  I appreciate the opportunity to take part in this Kendyl's care. Please do not hesitate to contact me with questions.  Sincerely,   R. Edgar Frisk, MD

## 2016-01-03 ENCOUNTER — Ambulatory Visit: Payer: 59 | Admitting: Allergy and Immunology

## 2016-01-12 DIAGNOSIS — R05 Cough: Secondary | ICD-10-CM | POA: Diagnosis not present

## 2016-01-12 MED FILL — BENZONATATE 200 MG CAPSULE: 200 | 10 days supply | Qty: 30 | Fill #0

## 2016-01-16 ENCOUNTER — Ambulatory Visit (INDEPENDENT_AMBULATORY_CARE_PROVIDER_SITE_OTHER): Payer: 59 | Admitting: Allergy and Immunology

## 2016-01-16 ENCOUNTER — Encounter: Payer: Self-pay | Admitting: Allergy and Immunology

## 2016-01-16 VITALS — BP 120/78 | HR 80 | Resp 16

## 2016-01-16 DIAGNOSIS — K2970 Gastritis, unspecified, without bleeding: Secondary | ICD-10-CM

## 2016-01-16 DIAGNOSIS — J3089 Other allergic rhinitis: Secondary | ICD-10-CM | POA: Diagnosis not present

## 2016-01-16 DIAGNOSIS — L500 Allergic urticaria: Secondary | ICD-10-CM

## 2016-01-16 DIAGNOSIS — T783XXA Angioneurotic edema, initial encounter: Secondary | ICD-10-CM | POA: Diagnosis not present

## 2016-01-16 MED ORDER — FLUTICASONE PROPIONATE 50 MCG/ACT NA SUSP: NASAL | Status: DC

## 2016-01-16 MED FILL — FLUTICASONE PROP 50 MCG SPR: 50 | 30 days supply | Qty: 16 | Fill #0

## 2016-01-16 NOTE — Assessment & Plan Note (Addendum)
Uncertain etiology. Skin tests to select food allergens were negative today. NSAIDs and emotional stress commonly exacerbate urticaria but are not the underlying etiology in this case. Physical urticarias are negative by history (i.e. pressure-induced, temperature, vibration, solar, etc.). History and lesions are not consistent with urticaria pigmentosa so I am not suspicious for mastocytosis. There are no concomitant symptoms concerning for anaphylaxis or constitutional symptoms worrisome for an underlying malignancy. We will rule out other potential etiologies with labs. For symptom relief, patient is to take oral antihistamines as directed.  The following labs have been ordered: FCeRI antibody, TSH, anti-thyroglobulin antibody, thyroid peroxidase antibody, tryptase, urea breath test, CBC, CMP, and galactose-alpha-1,3-galactose IgE level.  The patient will be called with further recommendations after lab results have returned.  Continue H1/H2 receptor blockade, titrating to the lowest effective dose necessary to suppress urticaria.  Continue symptom/exposure journal as previously discussed. For any symptoms concerning for anaphylaxis, 911 is to be called immediately.

## 2016-01-16 NOTE — Assessment & Plan Note (Signed)
   Aeroallergen avoidance measures have been discussed and provided in written form.  Continue levocetirizine 5 mg daily as needed.  A prescription has been provided for fluticasone nasal spray, 2 sprays per nostril daily as needed. Proper nasal spray technique has been discussed and demonstrated.

## 2016-01-16 NOTE — Progress Notes (Signed)
Follow-up Note  RE: Andrea [name] MRN: [id number] DOB: 1955-12-05 Date of Office Visit: 01/16/2016  Primary care provider: REDMON,NOELLE, PA-C Referring provider: Lennie Odor, PA-C  History of present illness: HPI Comments: Andrea [name] is a 60 y.o. female with chronic/recurrent urticaria who returns today for allergy testing. We were unable to perform skin tests during her previous visit due to recent administration of an antihistamine.  She reports the hives resolved within several days of having started the H1/H2 receptor blockade is recommended during her previous visit.  She has decreased the dose sequentially and is currently taking levocetirizine 5 mg daily with adequate hive suppression.  She reports that her nasal allergy symptoms are less problematic than they used to be, particularly in the springtime and in the fall.  She "gets sneezy" around cats and has developed hives on her arms when handling a dog in the past.   Assessment and plan: Chronic urticaria Uncertain etiology. Skin tests to select food allergens were negative today. NSAIDs and emotional stress commonly exacerbate urticaria but are not the underlying etiology in this case. Physical urticarias are negative by history (i.e. pressure-induced, temperature, vibration, solar, etc.). History and lesions are not consistent with urticaria pigmentosa so I am not suspicious for mastocytosis. There are no concomitant symptoms concerning for anaphylaxis or constitutional symptoms worrisome for an underlying malignancy. We will rule out other potential etiologies with labs. For symptom relief, patient is to take oral antihistamines as directed.  The following labs have been ordered: FCeRI antibody, TSH, anti-thyroglobulin antibody, thyroid peroxidase antibody, tryptase, urea breath test, CBC, CMP, and galactose-alpha-1,3-galactose IgE level.  The patient will be called with further recommendations after lab results have  returned.  Continue H1/H2 receptor blockade, titrating to the lowest effective dose necessary to suppress urticaria.  Continue symptom/exposure journal as previously discussed. For any symptoms concerning for anaphylaxis, 911 is to be called immediately.  Angioedema Associated angioedema occurs in up to 50% of patients with chronic urticaria.  Treatment/diagnostic plan as outlined above.  Allergic rhinitis  Aeroallergen avoidance measures have been discussed and provided in written form.  Continue levocetirizine 5 mg daily as needed.  A prescription has been provided for fluticasone nasal spray, 2 sprays per nostril daily as needed. Proper nasal spray technique has been discussed and demonstrated.    Meds ordered this encounter  Medications  . fluticasone (FLONASE) 50 MCG/ACT nasal spray    Sig: USE TWO SPRAYS IN EACH NOSTRIL ONCE DAILY AS NEEDED    Dispense:  16 g    Refill:  5    Diagnositics: Environmental allergen skin testing: Food allergen skin testing:    Physical examination: Blood pressure 120/78, pulse 80, resp. rate 16, last menstrual period 05/30/2011.  General: Alert, interactive, in no acute distress. HEENT: TMs pearly gray, turbinates moderately edematous without discharge, post-pharynx mildly erythematous. Neck: Supple without lymphadenopathy. Lungs: Clear to auscultation without wheezing, rhonchi or rales. CV: Normal S1, S2 without murmurs. Skin: Warm and dry, without lesions or rashes.  The following portions of the patient's history were reviewed and updated as appropriate: allergies, current medications, past family history, past medical history, past social history, past surgical history and problem list.    Medication List       This list is accurate as of: 01/16/16 12:07 PM.  Always use your most recent med list.               fluticasone 50 MCG/ACT nasal spray  Commonly known as:  FLONASE  USE TWO SPRAYS IN EACH NOSTRIL ONCE DAILY AS  NEEDED     levocetirizine 5 MG tablet  Commonly known as:  XYZAL  Take 1 tablet (5 mg total) by mouth every evening.     valsartan-hydrochlorothiazide 80-12.5 MG tablet  Commonly known as:  DIOVAN-HCT  Take 1 tablet by mouth daily.        No Known Allergies   Review of systems: Constitutional: Negative for fever, chills and weight loss.  HENT: Negative for nosebleeds.   Positive for nasal congestion and sneezing. Eyes: Negative for blurred vision.  Respiratory: Negative for hemoptysis.   Negative for coughing, chest tightness, or wheezing. Cardiovascular: Negative for chest pain.  Gastrointestinal: Negative for diarrhea and constipation.  Genitourinary: Negative for dysuria.  Musculoskeletal: Negative for myalgias and joint pain.  Neurological: Negative for dizziness.  Endo/Heme/Allergies: Does not bruise/bleed easily.  Cutaneous: Positive for urticaria and angioedema.  Past Medical History  Diagnosis Date  . Hyperlipidemia   . Hypertension   . Obesity     Family History  Problem Relation Age of Onset  . Asthma Son     Social History   Social History  . Marital Status: Married    Spouse Name: N/A  . Number of Children: N/A  . Years of Education: N/A   Occupational History  . Not on file.   Social History Main Topics  . Smoking status: Never Smoker   . Smokeless tobacco: Never Used  . Alcohol Use: No  . Drug Use: No  . Sexual Activity: Not on file   Other Topics Concern  . Not on file   Social History Narrative    I appreciate the opportunity to take part in this Andrea [name]'s care. Please do not hesitate to contact me with questions.  Sincerely,   R. Edgar Frisk, MD

## 2016-01-16 NOTE — Assessment & Plan Note (Addendum)
Associated angioedema occurs in up to 50% of patients with chronic urticaria.  Treatment/diagnostic plan as outlined above. 

## 2016-01-16 NOTE — Patient Instructions (Addendum)
Chronic urticaria Uncertain etiology. Skin tests to select food allergens were negative today. NSAIDs and emotional stress commonly exacerbate urticaria but are not the underlying etiology in this case. Physical urticarias are negative by history (i.e. pressure-induced, temperature, vibration, solar, etc.). History and lesions are not consistent with urticaria pigmentosa so I am not suspicious for mastocytosis. There are no concomitant symptoms concerning for anaphylaxis or constitutional symptoms worrisome for an underlying malignancy. We will rule out other potential etiologies with labs. For symptom relief, patient is to take oral antihistamines as directed.  The following labs have been ordered: FCeRI antibody, TSH, anti-thyroglobulin antibody, thyroid peroxidase antibody, tryptase, urea breath test, CBC, CMP, and galactose-alpha-1,3-galactose IgE level.  The patient will be called with further recommendations after lab results have returned.  Continue H1/H2 receptor blockade, titrating to the lowest effective dose necessary to suppress urticaria.  Continue symptom/exposure journal as previously discussed. For any symptoms concerning for anaphylaxis, 911 is to be called immediately.  Angioedema Associated angioedema occurs in up to 50% of patients with chronic urticaria.  Treatment/diagnostic plan as outlined above.  Allergic rhinitis  Aeroallergen avoidance measures have been discussed and provided in written form.  Continue levocetirizine 5 mg daily as needed.  A prescription has been provided for fluticasone nasal spray, 2 sprays per nostril daily as needed. Proper nasal spray technique has been discussed and demonstrated.    When lab results have returned the patient will be called with further recommendations and follow up instructions.  Urticaria (Hives)  . Levocetirizine (Xyzal) 5 mg in morning and Cetirizine (Zyrtec) 10mg  at night and ranitidine (Zantac) 150 mg twice a day.  If no symptoms for 7-14 days then decrease to. . Levocetirizine (Xyzal) 5 mg in morning and Cetirizine (Zyrtec) 10mg  at night and ranitidine (Zantac) 150 mg once a day.  If no symptoms for 7-14 days then decrease to. . Levocetirizine (Xyzal) 5 mg in morning and Cetirizine (Zyrtec) 10mg  at night.  If no symptoms for 7-14 days then decrease to. . Levocetirizine (Xyzal) 5 mg once a day.  May use Benadryl (diphenhydramine) as needed for breakthrough symptoms       If symptoms return, then step up dosage  Control of Dog or Cat Allergen  Avoidance is the best way to manage a dog or cat allergy. If you have a dog or cat and are allergic to dog or cats, consider removing the dog or cat from the home. If you have a dog or cat but don't want to find it a new home, or if your family wants a pet even though someone in the household is allergic, here are some strategies that may help keep symptoms at bay:  1. Keep the pet out of your bedroom and restrict it to only a few rooms. Be advised that keeping the dog or cat in only one room will not limit the allergens to that room. 2. Don't pet, hug or kiss the dog or cat; if you do, wash your hands with soap and water. 3. High-efficiency particulate air (HEPA) cleaners run continuously in a bedroom or living room can reduce allergen levels over time. 4. Regular use of a high-efficiency vacuum cleaner or a central vacuum can reduce allergen levels. 5. Giving your dog or cat a bath at least once a week can reduce airborne allergen.  Control of Mold Allergen  Mold and fungi can grow on a variety of surfaces provided certain temperature and moisture conditions exist.  Outdoor molds grow on plants, decaying  vegetation and soil.  The major outdoor mold, Alternaria and Cladosporium, are found in very high numbers during hot and dry conditions.  Generally, a late Summer - Fall peak is seen for common outdoor fungal spores.  Rain will temporarily lower outdoor mold spore  count, but counts rise rapidly when the rainy period ends.  The most important indoor molds are Aspergillus and Penicillium.  Dark, humid and poorly ventilated basements are ideal sites for mold growth.  The next most common sites of mold growth are the bathroom and the kitchen.  Outdoor [employer] 2. Use air conditioning and keep windows closed 3. Avoid exposure to decaying vegetation. 4. Avoid leaf raking. 5. Avoid grain handling. 6. Consider wearing a face mask if working in moldy areas.  Indoor Mold Control 1. Maintain humidity below 50%. 2. Clean washable surfaces with 5% bleach solution. 3. Remove sources e.g. Contaminated carpets.

## 2016-01-18 LAB — CBC WITH DIFFERENTIAL/PLATELET
Basophils Absolute: 0.1 10*3/uL (ref 0.0–0.1)
Basophils Relative: 1 % (ref 0–1)
Eosinophils Absolute: 0.4 10*3/uL (ref 0.0–0.7)
Eosinophils Relative: 4 % (ref 0–5)
HCT: 39.3 % (ref 36.0–46.0)
Hemoglobin: 13.2 g/dL (ref 12.0–15.0)
Lymphocytes Relative: 31 % (ref 12–46)
Lymphs Abs: 2.9 10*3/uL (ref 0.7–4.0)
MCH: 30 pg (ref 26.0–34.0)
MCHC: 33.6 g/dL (ref 30.0–36.0)
MCV: 89.3 fL (ref 78.0–100.0)
MPV: 10.2 fL (ref 8.6–12.4)
Monocytes Absolute: 0.9 10*3/uL (ref 0.1–1.0)
Monocytes Relative: 10 % (ref 3–12)
Neutro Abs: 5.1 10*3/uL (ref 1.7–7.7)
Neutrophils Relative %: 54 % (ref 43–77)
Platelets: 269 10*3/uL (ref 150–400)
RBC: 4.4 MIL/uL (ref 3.87–5.11)
RDW: 14.1 % (ref 11.5–15.5)
WBC: 9.4 10*3/uL (ref 4.0–10.5)

## 2016-01-18 LAB — COMPREHENSIVE METABOLIC PANEL
ALT: 23 U/L (ref 6–29)
AST: 17 U/L (ref 10–35)
Albumin: 3.9 g/dL (ref 3.6–5.1)
Alkaline Phosphatase: 80 U/L (ref 33–130)
BUN: 17 mg/dL (ref 7–25)
CO2: 28 mmol/L (ref 20–31)
Calcium: 9 mg/dL (ref 8.6–10.4)
Chloride: 104 mmol/L (ref 98–110)
Creat: 0.51 mg/dL (ref 0.50–1.05)
Glucose, Bld: 108 mg/dL — ABNORMAL HIGH (ref 65–99)
Potassium: 3.6 mmol/L (ref 3.5–5.3)
Sodium: 141 mmol/L (ref 135–146)
Total Bilirubin: 0.6 mg/dL (ref 0.2–1.2)
Total Protein: 6.7 g/dL (ref 6.1–8.1)

## 2016-01-18 LAB — H. PYLORI BREATH TEST: H. pylori Breath Test: NOT DETECTED

## 2016-01-19 LAB — TRYPTASE: Tryptase: 4.6 ug/L (ref <11)

## 2016-01-20 LAB — ALPHA-GAL PANEL
Allergen, Mutton, f88: 0.13 kU/L — ABNORMAL HIGH
Allergen, Pork, f26: 0.15 kU/L — ABNORMAL HIGH
Beef: <0.1 kU/L
Galactose-alpha-1,3-galactose IgE: <0.1 kU/L (ref <0.35)

## 2016-01-22 ENCOUNTER — Encounter: Payer: Self-pay | Admitting: *Deleted

## 2016-01-24 LAB — CP CHRONIC URTICARIA INDEX PANEL
Histamine Release: <16 % (ref <16)
TSH: 1.2 mIU/L
Thyroglobulin Ab: <1 IU/mL (ref <2)
Thyroperoxidase Ab SerPl-aCnc: 1 IU/mL (ref <9)

## 2016-02-29 ENCOUNTER — Ambulatory Visit: Payer: Self-pay

## 2016-02-29 ENCOUNTER — Other Ambulatory Visit: Payer: Self-pay | Admitting: Occupational Medicine

## 2016-02-29 DIAGNOSIS — M25562 Pain in left knee: Secondary | ICD-10-CM

## 2016-03-05 ENCOUNTER — Other Ambulatory Visit (HOSPITAL_COMMUNITY): Payer: Self-pay | Admitting: Family Medicine

## 2016-03-05 DIAGNOSIS — T148XXA Other injury of unspecified body region, initial encounter: Secondary | ICD-10-CM

## 2016-03-07 ENCOUNTER — Ambulatory Visit (HOSPITAL_COMMUNITY)
Admission: RE | Admit: 2016-03-07 | Discharge: 2016-03-07 | Disposition: A | Discharge status: Home / Self Care | Payer: PRIVATE HEALTH INSURANCE | Source: Ambulatory Visit | Attending: Family Medicine | Admitting: Family Medicine

## 2016-03-07 DIAGNOSIS — T1490 Injury, unspecified: Secondary | ICD-10-CM | POA: Diagnosis present

## 2016-03-07 DIAGNOSIS — M7122 Synovial cyst of popliteal space [Baker], left knee: Secondary | ICD-10-CM | POA: Insufficient documentation

## 2016-03-07 DIAGNOSIS — T148XXA Other injury of unspecified body region, initial encounter: Secondary | ICD-10-CM

## 2016-03-25 MED FILL — VALSARTAN-HCTZ 80-12.5 MG T: 80-12.5 | 90 days supply | Qty: 90 | Fill #1

## 2016-04-17 DIAGNOSIS — I10 Essential (primary) hypertension: Secondary | ICD-10-CM | POA: Diagnosis not present

## 2016-04-17 DIAGNOSIS — Z23 Encounter for immunization: Secondary | ICD-10-CM | POA: Diagnosis not present

## 2016-06-21 MED FILL — VALSARTAN-HCTZ 80-12.5 MG T: 80-12.5 | 90 days supply | Qty: 90 | Fill #0

## 2016-06-27 DIAGNOSIS — L814 Other melanin hyperpigmentation: Secondary | ICD-10-CM | POA: Diagnosis not present

## 2016-06-27 DIAGNOSIS — L821 Other seborrheic keratosis: Secondary | ICD-10-CM | POA: Diagnosis not present

## 2016-06-27 DIAGNOSIS — L570 Actinic keratosis: Secondary | ICD-10-CM | POA: Diagnosis not present

## 2016-07-05 DIAGNOSIS — H524 Presbyopia: Secondary | ICD-10-CM | POA: Diagnosis not present

## 2016-07-05 DIAGNOSIS — H52223 Regular astigmatism, bilateral: Secondary | ICD-10-CM | POA: Diagnosis not present

## 2016-09-17 ENCOUNTER — Telehealth: Payer: Self-pay | Admitting: Family

## 2016-09-17 DIAGNOSIS — J3089 Other allergic rhinitis: Secondary | ICD-10-CM

## 2016-09-17 MED ORDER — FLUTICASONE PROPIONATE 50 MCG/ACT NA SUSP: NASAL | 1 refills | Status: DC

## 2016-09-17 MED FILL — FLUTICASONE PROP 50 MCG SPR: 50 | 30 days supply | Qty: 16 | Fill #0

## 2016-09-17 NOTE — Progress Notes (Signed)

## 2016-09-19 MED FILL — VALSARTAN-HCTZ 80-12.5 MG T: 80-12.5 | 90 days supply | Qty: 90 | Fill #1

## 2016-10-03 ENCOUNTER — Other Ambulatory Visit: Payer: Self-pay | Admitting: Physician Assistant

## 2016-10-03 DIAGNOSIS — Z1231 Encounter for screening mammogram for malignant neoplasm of breast: Secondary | ICD-10-CM

## 2016-10-30 ENCOUNTER — Other Ambulatory Visit: Payer: Self-pay | Admitting: Physician Assistant

## 2016-10-30 ENCOUNTER — Other Ambulatory Visit (HOSPITAL_COMMUNITY)
Admission: RE | Admit: 2016-10-30 | Discharge: 2016-10-30 | Disposition: A | Discharge status: Home / Self Care | Payer: 59 | Source: Ambulatory Visit | Attending: Family Medicine | Admitting: Family Medicine

## 2016-10-30 DIAGNOSIS — Z0000 Encounter for general adult medical examination without abnormal findings: Secondary | ICD-10-CM | POA: Diagnosis not present

## 2016-10-30 DIAGNOSIS — Z124 Encounter for screening for malignant neoplasm of cervix: Secondary | ICD-10-CM | POA: Insufficient documentation

## 2016-10-30 DIAGNOSIS — I10 Essential (primary) hypertension: Secondary | ICD-10-CM | POA: Diagnosis not present

## 2016-10-30 DIAGNOSIS — Z23 Encounter for immunization: Secondary | ICD-10-CM | POA: Diagnosis not present

## 2016-10-31 LAB — CYTOLOGY - PAP: Diagnosis: NEGATIVE

## 2016-11-01 ENCOUNTER — Ambulatory Visit
Admission: RE | Admit: 2016-11-01 | Discharge: 2016-11-01 | Disposition: A | Discharge status: Home / Self Care | Payer: 59 | Source: Ambulatory Visit | Attending: Physician Assistant | Admitting: Physician Assistant

## 2016-11-01 DIAGNOSIS — Z1231 Encounter for screening mammogram for malignant neoplasm of breast: Secondary | ICD-10-CM

## 2016-12-02 ENCOUNTER — Telehealth: Payer: 59 | Admitting: Family

## 2016-12-02 DIAGNOSIS — J111 Influenza due to unidentified influenza virus with other respiratory manifestations: Secondary | ICD-10-CM | POA: Diagnosis not present

## 2016-12-02 MED ORDER — OSELTAMIVIR PHOSPHATE 75 MG PO CAPS: 75.0000 mg | ORAL_CAPSULE | Freq: Two times a day (BID) | ORAL | 0 refills | Status: DC

## 2016-12-02 NOTE — Progress Notes (Signed)

## 2016-12-24 MED FILL — VALSARTAN-HCTZ 80-12.5 MG T: 80-12.5 | 90 days supply | Qty: 90 | Fill #2

## 2017-01-10 DIAGNOSIS — R0602 Shortness of breath: Secondary | ICD-10-CM | POA: Diagnosis not present

## 2017-01-10 DIAGNOSIS — R062 Wheezing: Secondary | ICD-10-CM | POA: Diagnosis not present

## 2017-01-10 MED FILL — VENTOLIN HFA 90 MCG INHALER: 108 (90 BAS | 25 days supply | Qty: 18 | Fill #0

## 2017-03-26 MED FILL — VALSARTAN-HCTZ 80-12.5 MG T: 80-12.5 | 90 days supply | Qty: 90 | Fill #0

## 2017-03-26 MED FILL — DENTA 5000 PLUS CREAM: 1.1 | 30 days supply | Qty: 51 | Fill #0

## 2017-04-09 DIAGNOSIS — Z1211 Encounter for screening for malignant neoplasm of colon: Secondary | ICD-10-CM | POA: Diagnosis not present

## 2017-05-15 DIAGNOSIS — I10 Essential (primary) hypertension: Secondary | ICD-10-CM | POA: Diagnosis not present

## 2017-05-15 DIAGNOSIS — M25562 Pain in left knee: Secondary | ICD-10-CM | POA: Diagnosis not present

## 2017-05-15 DIAGNOSIS — Z6838 Body mass index (BMI) 38.0-38.9, adult: Secondary | ICD-10-CM | POA: Diagnosis not present

## 2017-05-15 DIAGNOSIS — R5383 Other fatigue: Secondary | ICD-10-CM | POA: Diagnosis not present

## 2017-05-15 LAB — BASIC METABOLIC PANEL
BUN: 15 (ref 4–21)
CREATININE: 0.6 (ref <=1.1)
Glucose: 92
POTASSIUM: 3.9 (ref 3.4–5.3)
SODIUM: 140 (ref 137–147)

## 2017-05-15 LAB — TSH: TSH: 1.15 (ref <=5.90)

## 2017-05-15 LAB — HEPATIC FUNCTION PANEL
ALT: 21 (ref 7–35)
AST: 15 (ref 13–35)
BILIRUBIN, TOTAL: 1

## 2017-05-15 LAB — LIPID PANEL
Cholesterol: 195 (ref 0–200)
HDL: 60 (ref 35–70)
LDL CALC: 114
LDL/HDL RATIO: 3.3
TRIGLYCERIDES: 107 (ref 40–160)

## 2017-05-15 MED FILL — DICLOFENAC SODIUM 1% GEL: 1 | 30 days supply | Qty: 500 | Fill #0

## 2017-05-29 MED FILL — GAVILYTE-N SOLUTION: 420 | 15 days supply | Qty: 4000 | Fill #0

## 2017-06-03 ENCOUNTER — Encounter (INDEPENDENT_AMBULATORY_CARE_PROVIDER_SITE_OTHER): Payer: 59

## 2017-06-05 DIAGNOSIS — K5730 Diverticulosis of large intestine without perforation or abscess without bleeding: Secondary | ICD-10-CM | POA: Diagnosis not present

## 2017-06-05 DIAGNOSIS — Z1211 Encounter for screening for malignant neoplasm of colon: Secondary | ICD-10-CM | POA: Diagnosis not present

## 2017-06-14 IMAGING — MR MR HIP*R* W/O CM
4 of 5 series · 19 of 40 positions shown · non-contrast
Comparison: None.

CLINICAL DATA: Right hip pain for 3 months

EXAM:
MR OF THE RIGHT HIP WITHOUT CONTRAST
TECHNIQUE: Multiplanar, multisequence MR imaging was performed. No intravenous
contrast was administered.

[Series 3: T1 · coronal · 3.5mm · 0.90mm/px · 3 of 30 slices shown (1 of 2)]
[im 5/30]
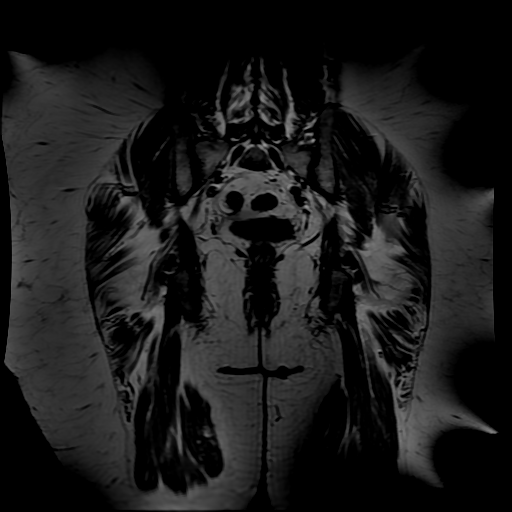
[im 17/30]
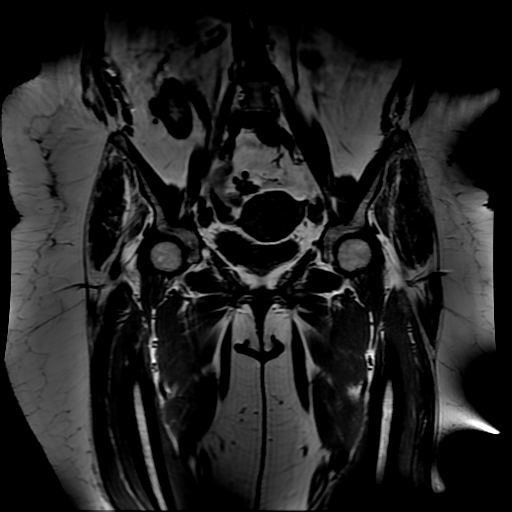
[im 25/30]
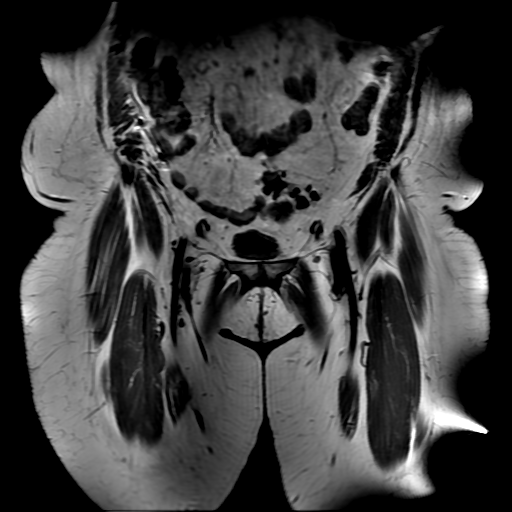

[Series 6: T2 fat-sat · axial · 3.5mm · 0.88mm/px · z∈[-53,+43]mm · 5 of 30 slices shown]
[im 1/30]
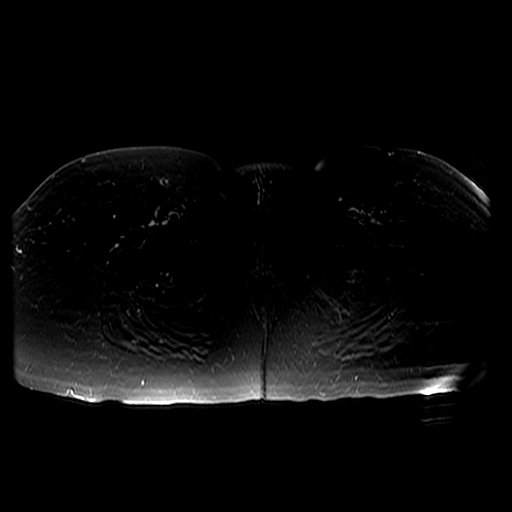
[im 5/30]
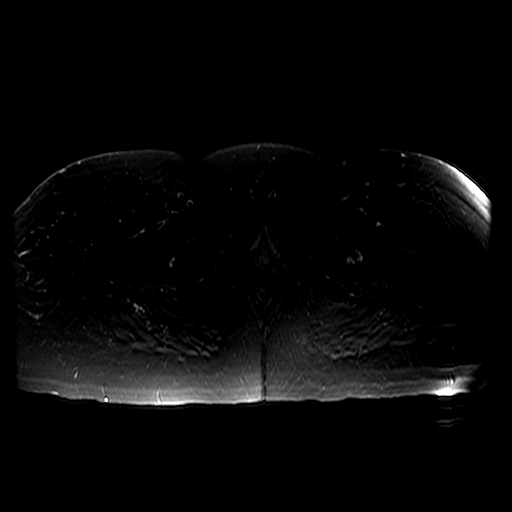
[im 9/30]
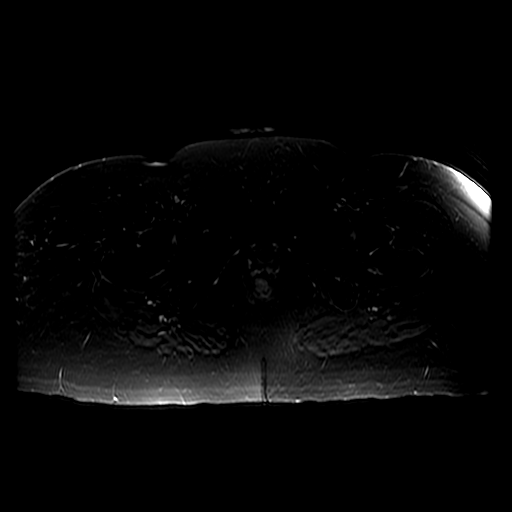
[im 17/30]
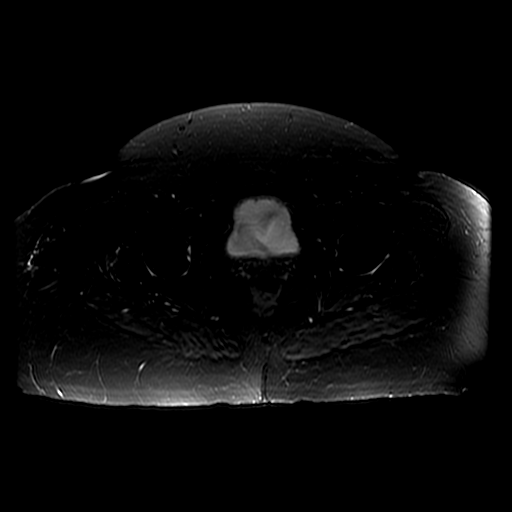
[im 25/30]
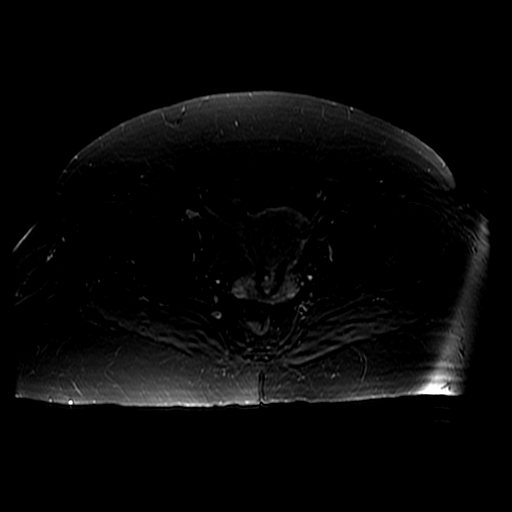

[Series 7: T1 · axial · 3.5mm · 0.88mm/px · z∈[-33,+47]mm · 3 of 29 slices shown (2 of 2)]
[im 5/29]
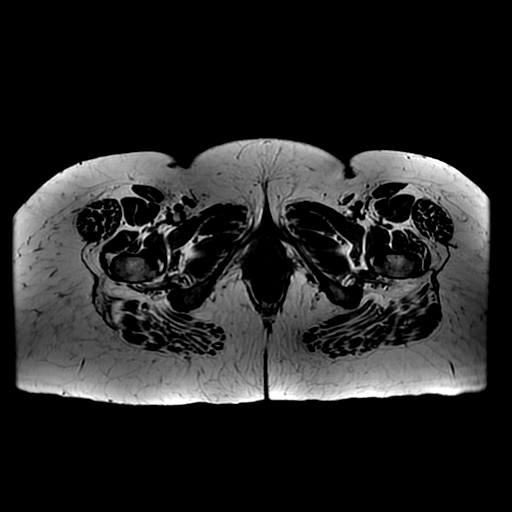
[im 17/29]
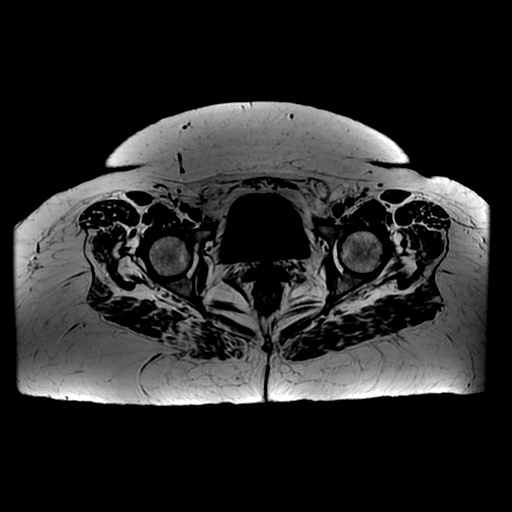
[im 25/29]
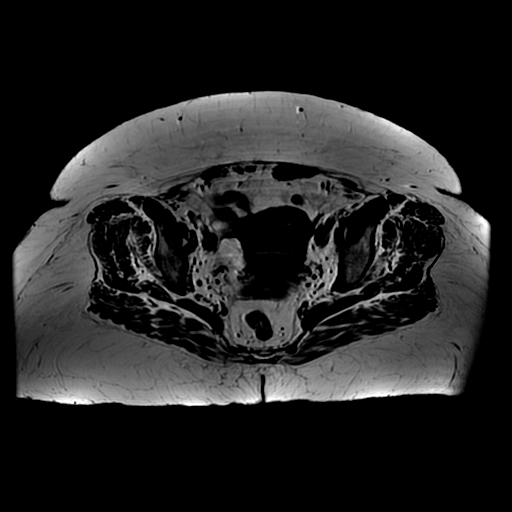

[Series 9: PD fat-sat · sagittal · 4.0mm · 0.35mm/px · 8 of 29 slices shown]
[im 1/29]
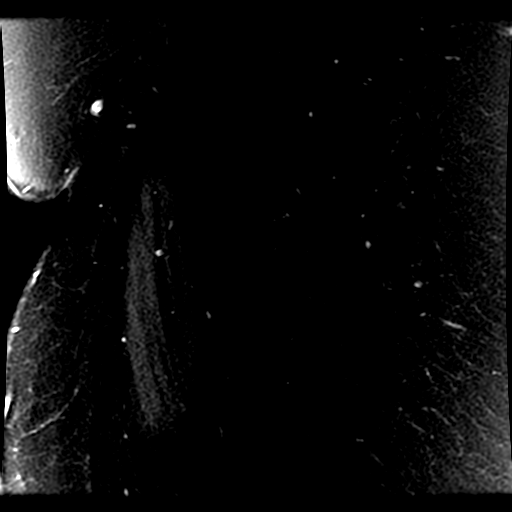
[im 5/29]
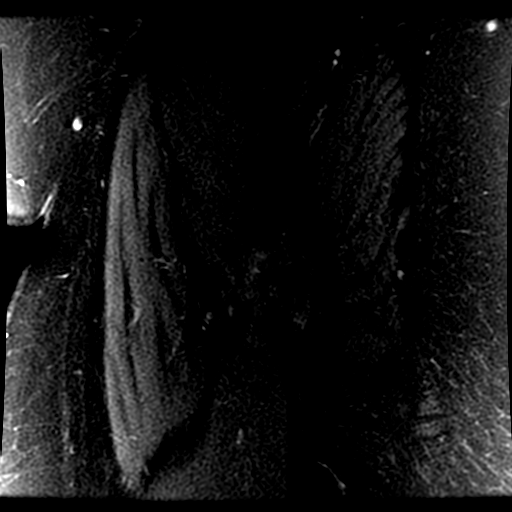
[im 9/29]
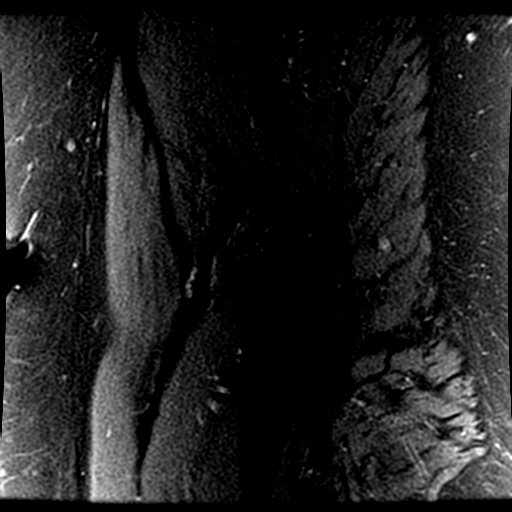
[im 13/29]
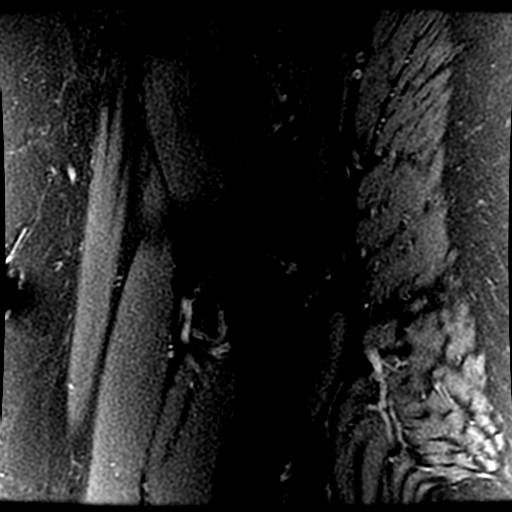
[im 17/29]
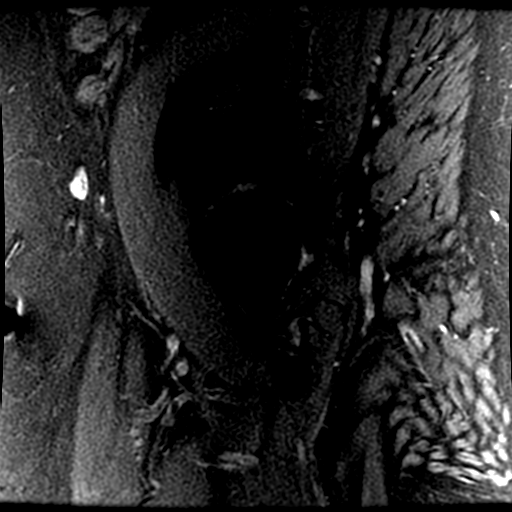
[im 21/29]
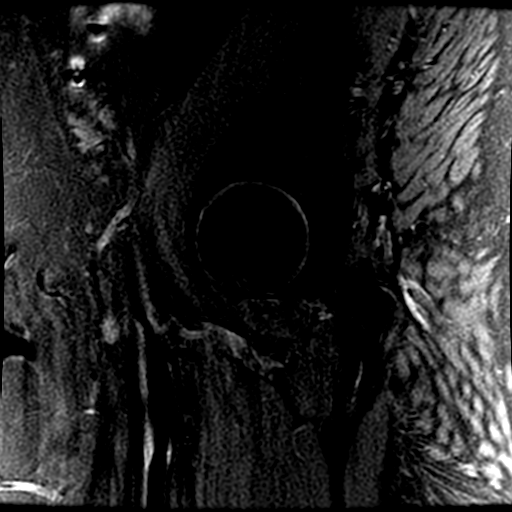
[im 25/29]
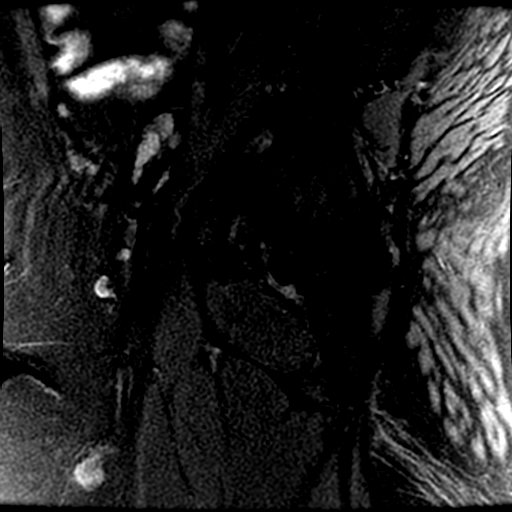
[im 29/29]
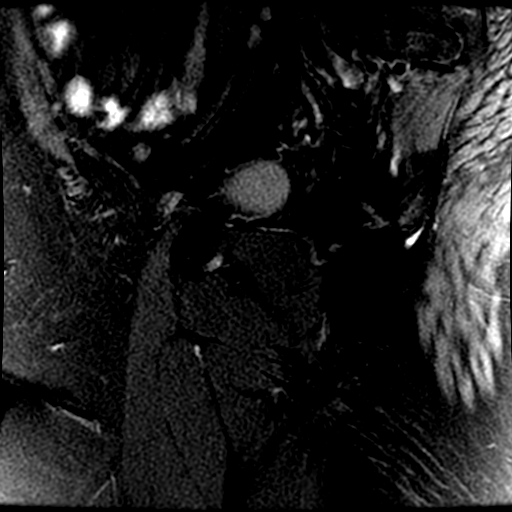

[19 of 40 positions shown; findings below may reference images not displayed]

FINDINGS: Bones: No marrow signal abnormality. No fracture or dislocation. No
avascular necrosis. Normal sacrum and sacroiliac joints.

Articular cartilage and labrum

Articular cartilage:  No chondral defect.

Labrum: Grossly intact, but evaluation is limited by lack of
intraarticular fluid.

Joint or bursal effusion

Joint effusion:  No joint effusion.

Bursae:  No bursa formation.

Muscles and tendons

Flexors: Normal.

Extensors: Normal.

Abductors: Normal.

Adductors: Normal.

Rotators: Mild tendinosis at the right gluteus medius and minimus
insertion.

Hamstrings: Normal.

Other findings

Miscellaneous: No fluid collection or hematoma. No pelvic free
fluid. No adnexal mass.
IMPRESSION: 1. No hip fracture, dislocation or avascular necrosis.
2. Mild tendinosis at the right gluteus medius and minimus
insertion.

## 2017-06-17 ENCOUNTER — Ambulatory Visit (INDEPENDENT_AMBULATORY_CARE_PROVIDER_SITE_OTHER): Payer: 59 | Admitting: Family Medicine

## 2017-06-17 ENCOUNTER — Encounter (INDEPENDENT_AMBULATORY_CARE_PROVIDER_SITE_OTHER): Payer: Self-pay | Admitting: Family Medicine

## 2017-06-17 ENCOUNTER — Encounter (INDEPENDENT_AMBULATORY_CARE_PROVIDER_SITE_OTHER): Payer: Self-pay

## 2017-06-17 VITALS — BP 144/77 | HR 68 | Temp 98.1°F | Ht 63.0 in | Wt 211.0 lb

## 2017-06-17 DIAGNOSIS — I10 Essential (primary) hypertension: Secondary | ICD-10-CM | POA: Diagnosis not present

## 2017-06-17 DIAGNOSIS — R0602 Shortness of breath: Secondary | ICD-10-CM | POA: Diagnosis not present

## 2017-06-17 DIAGNOSIS — Z9189 Other specified personal risk factors, not elsewhere classified: Secondary | ICD-10-CM

## 2017-06-17 DIAGNOSIS — E784 Other hyperlipidemia: Secondary | ICD-10-CM | POA: Diagnosis not present

## 2017-06-17 DIAGNOSIS — R5383 Other fatigue: Secondary | ICD-10-CM | POA: Diagnosis not present

## 2017-06-17 DIAGNOSIS — E669 Obesity, unspecified: Secondary | ICD-10-CM | POA: Diagnosis not present

## 2017-06-17 DIAGNOSIS — Z6837 Body mass index (BMI) 37.0-37.9, adult: Secondary | ICD-10-CM

## 2017-06-17 DIAGNOSIS — Z1389 Encounter for screening for other disorder: Secondary | ICD-10-CM | POA: Diagnosis not present

## 2017-06-17 DIAGNOSIS — Z1331 Encounter for screening for depression: Secondary | ICD-10-CM

## 2017-06-17 DIAGNOSIS — E7849 Other hyperlipidemia: Secondary | ICD-10-CM | POA: Insufficient documentation

## 2017-06-17 DIAGNOSIS — IMO0001 Reserved for inherently not codable concepts without codable children: Secondary | ICD-10-CM | POA: Insufficient documentation

## 2017-06-17 NOTE — Progress Notes (Signed)
.  Office: [phone]  /  Fax: [phone]   HPI:   Chief Complaint: OBESITY  Andrea [name] (MR# [id number]) is a 61 y.o. female who presents on 06/17/2017 for obesity evaluation and treatment. Current BMI is Body mass index is 37.38 kg/m.Marland Kitchen Andrea [name] has struggled with obesity for years and has been unsuccessful in either losing weight or maintaining long term weight loss. Andrea [name] attended our information session and states she is currently in the action stage of change and ready to dedicate time achieving and maintaining a healthier weight.  Andrea [name] states her family eats meals together she thinks her family will eat healthier with  her her desired weight loss is 65 lbs she started gaining weight in her late 105's her heaviest weight ever was 215 lbs. she has significant food cravings issues  she snacks frequently in the evenings she is frequently drinking liquids with calories she frequently makes poor food choices she frequently eats larger portions than normal  she has binge eating behaviors she struggles with emotional eating    Fatigue Datra feels her energy is lower than it should be. This has worsened with weight gain and has not worsened recently. Andrea [name] admits to daytime somnolence and  admits to waking up still tired. Patient is at risk for obstructive sleep apnea. Patent has a history of symptoms of daytime fatigue and morning fatigue. Patient generally gets 6 hours of sleep per night, and states they generally have restless sleep. Snoring is present. Apneic episodes are not present. Epworth Sleepiness Score is 7  Dyspnea on exertion Andrea [name] notes increasing shortness of breath with exercising and seems to be worsening over time with weight gain. She notes getting out of breath sooner with activity than she used to. This has not gotten worse recently. Andrea [name] denies orthopnea.  Hypertension Andrea [name] is a 61 y.o. female with hypertension. Her blood pressure is elevated today and  she states her blood pressure is normally well controlled. TOPAZ RAGLIN denies chest pain or headache. She is working weight loss to help control her blood pressure with the goal of decreasing her risk of heart attack and stroke. Andrea [name] blood pressure is not currently controlled.  Hyperlipidemia Andrea [name] has hyperlipidemia. Her LDL is slightly elevated and she is not on a statin. She would like to improve her cholesterol levels with intensive lifestyle modification including a low saturated fat diet, exercise and weight loss. She denies any chest pain, claudication or myalgias.  At risk for cardiovascular disease Andrea [name] is at a higher than average risk for cardiovascular disease due to obesity, hypertension and hyperlipidemia. She currently denies any chest pain.  Depression Screen Andrea [name]'s Food and Mood (modified PHQ-9) score was  Depression screen PHQ 2/9 06/17/2017  Decreased Interest 1  Down, Depressed, Hopeless 3  PHQ - 2 Score 4  Altered sleeping 3  Tired, decreased energy 3  Change in appetite 1  Feeling bad or failure about yourself  1  Trouble concentrating 0  Moving slowly or fidgety/restless 0  Suicidal thoughts 0  PHQ-9 Score 12  Difficult doing work/chores Not difficult at all    ALLERGIES: Allergies  Allergen Reactions  . Pork-Derived Metallurgist  . Budesonide Other (See Comments)    Per patch test, pt has never had medication.    MEDICATIONS: Current Outpatient Prescriptions on File Prior to Visit  Medication Sig Dispense Refill  . valsartan-hydrochlorothiazide (DIOVAN-HCT) 80-12.5 MG per tablet Take 1 tablet by mouth daily.    . fluticasone (FLONASE)  50 MCG/ACT nasal spray USE TWO SPRAYS IN EACH NOSTRIL ONCE DAILY AS NEEDED (Patient not taking: Reported on 06/17/2017) 16 g 1  . levocetirizine (XYZAL) 5 MG tablet Take 1 tablet (5 mg total) by mouth every evening. (Patient not taking: Reported on 06/17/2017) 30 tablet 5   Current Facility-Administered Medications on  File Prior to Visit  Medication Dose Route Frequency Provider Last Rate Last Dose  . predniSONE (DELTASONE) tablet 10 mg  10 mg Oral UD Bobbitt, [place] Muta, MD        PAST MEDICAL HISTORY: Past Medical History:  Diagnosis Date  . Gallbladder problem   . GERD (gastroesophageal reflux disease)   . Hyperlipidemia   . Hypertension   . Joint pain   . Leg edema   . Multiple food allergies   . Obesity     PAST SURGICAL HISTORY: Past Surgical History:  Procedure Laterality Date  . APPENDECTOMY    . CESAREAN SECTION    . HERNIA REPAIR    . LAPAROSCOPIC CHOLECYSTECTOMY    . POSTERIOR TIBIAL TENDON REPAIR    . TONSILECTOMY, ADENOIDECTOMY, BILATERAL MYRINGOTOMY AND TUBES  1983    SOCIAL HISTORY: Social History  Substance Use Topics  . Smoking status: Never Smoker  . Smokeless tobacco: Never Used  . Alcohol use No    FAMILY HISTORY: Family History  Problem Relation Age of Onset  . Asthma Son   . Stroke Mother   . Thyroid disease Mother   . Liver disease Father   . Alcoholism Father     ROS: Review of Systems  Constitutional: Positive for malaise/fatigue.  Eyes:       Wear Glasses (reading)  Respiratory: Positive for shortness of breath (with activity).   Cardiovascular: Negative for chest pain, orthopnea and claudication.  Musculoskeletal: Negative for myalgias.       Muscle or Joint Pain Muscle Stiffness  Neurological: Negative for headaches.  Psychiatric/Behavioral: The patient has insomnia.        Stress    PHYSICAL EXAM: Blood pressure (!) 144/77, pulse 68, temperature 98.1 F (36.7 C), temperature source Oral, height '5\' 3"'$  (1.6 m), weight 211 lb (95.7 kg), last menstrual period 05/30/2011, SpO2 96 %. Body mass index is 37.38 kg/m. Physical Exam  Constitutional: She is oriented to person, place, and time. She appears well-developed and well-nourished.  Cardiovascular: Normal rate.   Pulmonary/Chest: Effort normal.  Musculoskeletal: Normal range of  motion.  Neurological: She is oriented to person, place, and time.  Skin: Skin is warm and dry.  Psychiatric: She has a normal mood and affect. Her behavior is normal.  Vitals reviewed.   RECENT LABS AND TESTS: BMET    Component Value Date/Time   NA 141 01/16/2016 1622   K 3.6 01/16/2016 1622   CL 104 01/16/2016 1622   CO2 28 01/16/2016 1622   GLUCOSE 108 (H) 01/16/2016 1622   BUN 17 01/16/2016 1622   CREATININE 0.51 01/16/2016 1622   CALCIUM 9.0 01/16/2016 1622   GFRNONAA >60 09/25/2009 0825   GFRAA  09/25/2009 0825    >60        The eGFR has been calculated using the MDRD equation. This calculation has not been validated in all clinical situations. eGFR's persistently <60 mL/min signify possible Chronic Kidney Disease.   No results found for: HGBA1C No results found for: INSULIN CBC    Component Value Date/Time   WBC 9.4 01/16/2016 1622   RBC 4.40 01/16/2016 1622   HGB 13.2 01/16/2016 1622  HCT 39.3 01/16/2016 1622   PLT 269 01/16/2016 1622   MCV 89.3 01/16/2016 1622   MCH 30.0 01/16/2016 1622   MCHC 33.6 01/16/2016 1622   RDW 14.1 01/16/2016 1622   LYMPHSABS 2.9 01/16/2016 1622   MONOABS 0.9 01/16/2016 1622   EOSABS 0.4 01/16/2016 1622   BASOSABS 0.1 01/16/2016 1622   Iron/TIBC/Ferritin/ %Sat No results found for: IRON, TIBC, FERRITIN, IRONPCTSAT Lipid Panel  No results found for: CHOL, TRIG, HDL, CHOLHDL, VLDL, LDLCALC, LDLDIRECT Hepatic Function Panel     Component Value Date/Time   PROT 6.7 01/16/2016 1622   ALBUMIN 3.9 01/16/2016 1622   AST 17 01/16/2016 1622   ALT 23 01/16/2016 1622   ALKPHOS 80 01/16/2016 1622   BILITOT 0.6 01/16/2016 1622      Component Value Date/Time   TSH 1.20 01/16/2016 1622    ECG  shows NSR with a rate of 66 BPM INDIRECT CALORIMETER done today shows a VO2 of 205 and a REE of 1424. Her calculated basal metabolic rate is 2683 thus her basal metabolic rate is worse than expected.    ASSESSMENT AND PLAN: Other  fatigue - Plan: CBC with Differential/Platelet, Hemoglobin A1c, Insulin, random, Folate, VITAMIN D 25 Hydroxy (Vit-D Deficiency, Fractures), Vitamin B12, T3, T4, free, TSH  Shortness of breath on exertion  Essential hypertension - Plan: Comprehensive metabolic panel  Other hyperlipidemia - Plan: Lipid Panel With LDL/HDL Ratio  Depression screening  At risk for heart disease  Class 2 obesity with serious comorbidity and body mass index (BMI) of 37.0 to 37.9 in adult, unspecified obesity type  PLAN:  Fatigue Renessa was informed that her fatigue may be related to obesity, depression or many other causes. Labs will be ordered, and in the meanwhile Icie has agreed to work on diet, exercise and weight loss to help with fatigue. Proper sleep hygiene was discussed including the need for 7-8 hours of quality sleep each night. A sleep study was not ordered based on symptoms and Epworth score.  Dyspnea on exertion Andrea [name]'s shortness of breath appears to be obesity related and exercise induced. She has agreed to work on weight loss and gradually increase exercise to treat her exercise induced shortness of breath. If Andrea [name] follows our instructions and loses weight without improvement of her shortness of breath, we will plan to refer to pulmonology. We will monitor this condition regularly. Andrea [name] agrees to this plan.  Hypertension We discussed sodium restriction, working on healthy weight loss, and a regular exercise program as the means to achieve improved blood pressure control. Oma agreed with this plan and agreed to follow up as directed. We will check labs and will continue to monitor her blood pressure as well as her progress with the above lifestyle modifications. She will watch for signs of hypotension as she continues her lifestyle modifications.  Hyperlipidemia Andrea [name] was informed of the American Heart Association Guidelines emphasizing intensive lifestyle modifications as the first line  treatment for hyperlipidemia. We discussed many lifestyle modifications today in depth, and Natajah will continue to work on decreasing saturated fats such as fatty red meat, butter and many fried foods. She will also increase vegetables and lean protein in her diet and continue to work on exercise and weight loss efforts. We will check labs and Adleigh agrees to follow up with our clinic in 2 weeks.  Cardiovascular risk counseling Andrea [name] was given extended (15 minutes) coronary artery disease prevention counseling today. She is 61 y.o. female and has risk factors for heart  disease including obesity, hypertension and hyperlipidemia. We discussed intensive lifestyle modifications today with an emphasis on specific weight loss instructions and strategies. Pt was also informed of the importance of increasing exercise and decreasing saturated fats to help prevent heart disease.  Depression Screen Andrea [name] had a moderately positive depression screening. Depression is commonly associated with obesity and often results in emotional eating behaviors. We will monitor this closely and work on CBT to help improve the non-hunger eating patterns. Referral to Psychology may be required if no improvement is seen as she continues in our clinic.  Obesity Andrea [name] is currently in the action stage of change and her goal is to continue with weight loss efforts She has agreed to follow the Category 2 plan Andrea [name] has been instructed to work up to a goal of 150 minutes of combined cardio and strengthening exercise per week for weight loss and overall health benefits. We discussed the following Behavioral Modification Strategies today: meal planning & cooking strategies, increasing lean protein intake and decreasing simple carbohydrates   Andrea [name] has agreed to follow up with our clinic in 2 weeks. She was informed of the importance of frequent follow up visits to maximize her success with intensive lifestyle modifications for her multiple  health conditions. She was informed we would discuss her lab results at her next visit unless there is a critical issue that needs to be addressed sooner. Andrea [name] agreed to keep her next visit at the agreed upon time to discuss these results.  I, Doreene Nest, am acting as transcriptionist for Dennard Nip, MD  I have reviewed the above documentation for accuracy and completeness, and I agree with the above. -Dennard Nip, MD  OBESITY BEHAVIORAL INTERVENTION VISIT  Today's visit was # 1 out of 71.  Starting weight: 211 lbs Starting date: 06/17/17 Today's weight : 211 lbs Today's date: 06/17/2017 Total lbs lost to date: 0 (Patients must lose 7 lbs in the first 6 months to continue with counseling)   ASK: We discussed the diagnosis of obesity with Andrea [name] today and Andrea [name] agreed to give [place] permission to discuss obesity behavioral modification therapy today.  ASSESS: Andrea [name] has the diagnosis of obesity and her BMI today is 37.39 Andrea [name] is in the action stage of change   ADVISE: Andrea [name] was educated on the multiple health risks of obesity as well as the benefit of weight loss to improve her health. She was advised of the need for long term treatment and the importance of lifestyle modifications.  AGREE: Multiple dietary modification options and treatment options were discussed and  Idolina agreed to follow the Category 2 plan We discussed the following Behavioral Modification Strategies today: meal planning & cooking strategies, increasing lean protein intake and decreasing simple carbohydrates

## 2017-06-18 LAB — CBC WITH DIFFERENTIAL/PLATELET
BASOS ABS: 0 10*3/uL (ref 0.0–0.2)
BASOS: 1 %
EOS (ABSOLUTE): 0.1 10*3/uL (ref 0.0–0.4)
EOS: 2 %
HEMOGLOBIN: 14.1 g/dL (ref 11.1–15.9)
Hematocrit: 43.7 % (ref 34.0–46.6)
IMMATURE GRANS (ABS): 0 10*3/uL (ref 0.0–0.1)
IMMATURE GRANULOCYTES: 0 %
LYMPHS: 30 %
Lymphocytes Absolute: 2 10*3/uL (ref 0.7–3.1)
MCH: 29.5 pg (ref 26.6–33.0)
MCHC: 32.3 g/dL (ref 31.5–35.7)
MCV: 91 fL (ref 79–97)
MONOCYTES: 8 %
Monocytes Absolute: 0.5 10*3/uL (ref 0.1–0.9)
NEUTROS ABS: 3.9 10*3/uL (ref 1.4–7.0)
NEUTROS PCT: 59 %
Platelets: 282 10*3/uL (ref 150–379)
RBC: 4.78 x10E6/uL (ref 3.77–5.28)
RDW: 14.7 % (ref 12.3–15.4)
WBC: 6.7 10*3/uL (ref 3.4–10.8)

## 2017-06-18 LAB — LIPID PANEL WITH LDL/HDL RATIO
CHOLESTEROL TOTAL: 181 mg/dL (ref 100–199)
HDL: 60 mg/dL (ref >39)
LDL CALC: 99 mg/dL (ref 0–99)
LDl/HDL Ratio: 1.7 ratio (ref 0.0–3.2)
Triglycerides: 109 mg/dL (ref 0–149)
VLDL CHOLESTEROL CAL: 22 mg/dL (ref 5–40)

## 2017-06-18 LAB — COMPREHENSIVE METABOLIC PANEL
ALK PHOS: 83 IU/L (ref 39–117)
ALT: 21 IU/L (ref 0–32)
AST: 21 IU/L (ref 0–40)
Albumin/Globulin Ratio: 1.4 (ref 1.2–2.2)
Albumin: 4.1 g/dL (ref 3.6–4.8)
BUN/Creatinine Ratio: 21 (ref 12–28)
BUN: 13 mg/dL (ref 8–27)
Bilirubin Total: 0.7 mg/dL (ref 0.0–1.2)
CALCIUM: 9.1 mg/dL (ref 8.7–10.3)
CO2: 20 mmol/L (ref 20–29)
CREATININE: 0.61 mg/dL (ref 0.57–1.00)
Chloride: 104 mmol/L (ref 96–106)
GFR calc Af Amer: 113 mL/min/{1.73_m2} (ref >59)
GFR, EST NON AFRICAN AMERICAN: 98 mL/min/{1.73_m2} (ref >59)
Globulin, Total: 2.9 g/dL (ref 1.5–4.5)
Glucose: 89 mg/dL (ref 65–99)
Potassium: 4.2 mmol/L (ref 3.5–5.2)
Sodium: 140 mmol/L (ref 134–144)
Total Protein: 7 g/dL (ref 6.0–8.5)

## 2017-06-18 LAB — HEMOGLOBIN A1C
Est. average glucose Bld gHb Est-mCnc: 114 mg/dL
HEMOGLOBIN A1C: 5.6 % (ref 4.8–5.6)

## 2017-06-18 LAB — T3: T3, Total: 120 ng/dL (ref 71–180)

## 2017-06-18 LAB — FOLATE: Folate: 13.6 ng/mL (ref >3.0)

## 2017-06-18 LAB — T4, FREE: Free T4: 1.09 ng/dL (ref 0.82–1.77)

## 2017-06-18 LAB — VITAMIN B12: VITAMIN B 12: 380 pg/mL (ref 232–1245)

## 2017-06-18 LAB — INSULIN, RANDOM: INSULIN: 14.3 u[IU]/mL (ref 2.6–24.9)

## 2017-06-18 LAB — VITAMIN D 25 HYDROXY (VIT D DEFICIENCY, FRACTURES): Vit D, 25-Hydroxy: 14.4 ng/mL — ABNORMAL LOW (ref 30.0–100.0)

## 2017-06-18 LAB — TSH: TSH: 1.23 u[IU]/mL (ref 0.450–4.500)

## 2017-06-25 MED FILL — VALSARTAN-HCTZ 80-12.5 MG T: 80-12.5 | 90 days supply | Qty: 90 | Fill #1

## 2017-07-01 ENCOUNTER — Ambulatory Visit (INDEPENDENT_AMBULATORY_CARE_PROVIDER_SITE_OTHER): Payer: 59 | Admitting: Family Medicine

## 2017-07-01 VITALS — BP 117/76 | HR 71 | Temp 98.1°F | Ht 63.0 in | Wt 204.0 lb

## 2017-07-01 DIAGNOSIS — E8881 Metabolic syndrome: Secondary | ICD-10-CM | POA: Diagnosis not present

## 2017-07-01 DIAGNOSIS — Z6836 Body mass index (BMI) 36.0-36.9, adult: Secondary | ICD-10-CM

## 2017-07-01 DIAGNOSIS — E669 Obesity, unspecified: Secondary | ICD-10-CM | POA: Diagnosis not present

## 2017-07-01 DIAGNOSIS — Z9189 Other specified personal risk factors, not elsewhere classified: Secondary | ICD-10-CM | POA: Diagnosis not present

## 2017-07-01 DIAGNOSIS — IMO0001 Reserved for inherently not codable concepts without codable children: Secondary | ICD-10-CM | POA: Insufficient documentation

## 2017-07-01 DIAGNOSIS — E559 Vitamin D deficiency, unspecified: Secondary | ICD-10-CM | POA: Insufficient documentation

## 2017-07-01 MED ORDER — VITAMIN D (ERGOCALCIFEROL) 1.25 MG (50000 UNIT) PO CAPS: 50000.0000 [IU] | ORAL_CAPSULE | ORAL | 0 refills | Status: DC

## 2017-07-01 MED FILL — VIT D2 1.25 MG (50,000 UNIT: 1.25 MG | 28 days supply | Qty: 4 | Fill #0

## 2017-07-01 NOTE — Progress Notes (Signed)
Office: [phone]  /  Fax: [phone]   HPI:   Chief Complaint: OBESITY Andrea [name] is here to discuss her progress with her obesity treatment plan. She is on the Category 2 plan and is following her eating plan approximately 95 % of the time. She states she is exercising 0 minutes 0 times per week. Andrea [name] did very well with her diet prescription and weight loss. She was hungry at mealtimes but not bad in-between meals. Andrea [name] struggled to eat all of her protein at dinner though. Her weight is 204 lb (92.5 kg) today and has had a weight loss of 7 pounds over a period of 2 weeks since her last visit. She has lost 7 lbs since starting treatment with [place].  Vitamin D deficiency Andrea [name] has a new diagnosis of vitamin D deficiency. She is not currently taking vit D and admits fatigue but denies nausea, vomiting or muscle weakness.  Insulin Resistance Andrea [name] has a new diagnosis of insulin resistance based on her elevated fasting insulin level >5. Her Hgb A1c and glucose are within normal limits. Although Andrea [name]'s blood glucose readings are still under good control, insulin resistance puts her at greater risk of metabolic syndrome and diabetes. She admits polyphagia but has minimal symptoms on the category 2 plan. She is not taking metformin currently and continues to work on diet and exercise to decrease risk of diabetes.  At risk for diabetes Andrea [name] is at higher than average risk for developing diabetes due to her obesity and insulin resistance. She currently denies polyuria or polydipsia.   ALLERGIES: Allergies  Allergen Reactions   Pork-Derived Products Hives   Budesonide Other (See Comments)    Per patch test, pt has never had medication.    MEDICATIONS: Current Outpatient Prescriptions on File Prior to Visit  Medication Sig Dispense Refill   diclofenac sodium (VOLTAREN) 1 % GEL Apply topically 4 (four) times daily.     valsartan-hydrochlorothiazide (DIOVAN-HCT) 80-12.5 MG per tablet Take  1 tablet by mouth daily.     Current Facility-Administered Medications on File Prior to Visit  Medication Dose Route Frequency Provider Last Rate Last Dose   predniSONE (DELTASONE) tablet 10 mg  10 mg Oral UD Bobbitt, [place] Muta, MD        PAST MEDICAL HISTORY: Past Medical History:  Diagnosis Date   Gallbladder problem    GERD (gastroesophageal reflux disease)    Hyperlipidemia    Hypertension    Joint pain    Leg edema    Multiple food allergies    Obesity     PAST SURGICAL HISTORY: Past Surgical History:  Procedure Laterality Date   APPENDECTOMY     CESAREAN SECTION     HERNIA REPAIR     LAPAROSCOPIC CHOLECYSTECTOMY     POSTERIOR TIBIAL TENDON REPAIR     TONSILECTOMY, ADENOIDECTOMY, BILATERAL MYRINGOTOMY AND TUBES  1983    SOCIAL HISTORY: Social History  Substance Use Topics   Smoking status: Never Smoker   Smokeless tobacco: Never Used   Alcohol use No    FAMILY HISTORY: Family History  Problem Relation Age of Onset   Asthma Son    Stroke Mother    Thyroid disease Mother    Liver disease Father    Alcoholism Father     ROS: Review of Systems  Constitutional: Positive for malaise/fatigue and weight loss.  Gastrointestinal: Negative for nausea and vomiting.  Genitourinary: Negative for frequency.  Musculoskeletal:       Negative muscle weakness  Endo/Heme/Allergies: Negative  for polydipsia.       Polyphagia    PHYSICAL EXAM: Blood pressure 117/76, pulse 71, temperature 98.1 F (36.7 C), temperature source Oral, height 5\' 3"  (1.6 m), weight 204 lb (92.5 kg), last menstrual period 05/30/2011, SpO2 97 %. Body mass index is 36.14 kg/m. Physical Exam  Constitutional: She is oriented to person, place, and time. She appears well-developed and well-nourished.  Cardiovascular: Normal rate.   Pulmonary/Chest: Effort normal.  Musculoskeletal: Normal range of motion.  Neurological: She is oriented to person, place, and time.  Skin:  Skin is warm and dry.  Psychiatric: She has a normal mood and affect. Her behavior is normal.  Vitals reviewed.   RECENT LABS AND TESTS: BMET    Component Value Date/Time   NA 140 06/17/2017 0940   K 4.2 06/17/2017 0940   CL 104 06/17/2017 0940   CO2 20 06/17/2017 0940   GLUCOSE 89 06/17/2017 0940   GLUCOSE 108 (H) 01/16/2016 1622   BUN 13 06/17/2017 0940   CREATININE 0.61 06/17/2017 0940   CREATININE 0.51 01/16/2016 1622   CALCIUM 9.1 06/17/2017 0940   GFRNONAA 98 06/17/2017 0940   GFRAA 113 06/17/2017 0940   Lab Results  Component Value Date   HGBA1C 5.6 06/17/2017   Lab Results  Component Value Date   INSULIN 14.3 06/17/2017   CBC    Component Value Date/Time   WBC 6.7 06/17/2017 0940   WBC 9.4 01/16/2016 1622   RBC 4.78 06/17/2017 0940   RBC 4.40 01/16/2016 1622   HGB 14.1 06/17/2017 0940   HCT 43.7 06/17/2017 0940   PLT 282 06/17/2017 0940   MCV 91 06/17/2017 0940   MCH 29.5 06/17/2017 0940   MCH 30.0 01/16/2016 1622   MCHC 32.3 06/17/2017 0940   MCHC 33.6 01/16/2016 1622   RDW 14.7 06/17/2017 0940   LYMPHSABS 2.0 06/17/2017 0940   MONOABS 0.9 01/16/2016 1622   EOSABS 0.1 06/17/2017 0940   BASOSABS 0.0 06/17/2017 0940   Iron/TIBC/Ferritin/ %Sat No results found for: IRON, TIBC, FERRITIN, IRONPCTSAT Lipid Panel     Component Value Date/Time   CHOL 181 06/17/2017 0940   TRIG 109 06/17/2017 0940   HDL 60 06/17/2017 0940   LDLCALC 99 06/17/2017 0940   Hepatic Function Panel     Component Value Date/Time   PROT 7.0 06/17/2017 0940   ALBUMIN 4.1 06/17/2017 0940   AST 21 06/17/2017 0940   ALT 21 06/17/2017 0940   ALKPHOS 83 06/17/2017 0940   BILITOT 0.7 06/17/2017 0940      Component Value Date/Time   TSH 1.230 06/17/2017 0940   TSH 1.15 05/15/2017   TSH 1.20 01/16/2016 1622    ASSESSMENT AND PLAN: Vitamin D deficiency - Plan: Vitamin D, Ergocalciferol, (DRISDOL) 50000 units CAPS capsule  Insulin resistance  At risk for diabetes  mellitus  Class 2 obesity with serious comorbidity and body mass index (BMI) of 36.0 to 36.9 in adult, unspecified obesity type  PLAN:  Vitamin D Deficiency Andrea [name] was informed that low vitamin D levels contributes to fatigue and are associated with obesity, breast, and colon cancer. She agrees to start to take prescription Vit D @50 ,000 IU every week #4 with no refills and we will re-check labs in 3 months and will follow up for routine testing of vitamin D, at least 2-3 times per year. She was informed of the risk of over-replacement of vitamin D and agrees to not increase her dose unless he discusses this with [place] first. Andrea [name]  agrees to follow up with our clinic in 2 weeks.  Insulin Resistance Andrea [name] will continue to work on weight loss, exercise, and decreasing simple carbohydrates in her diet to help decrease the risk of diabetes. We dicussed metformin including benefits and risks. She was informed that eating too many simple carbohydrates or too many calories at one sitting increases the likelihood of GI side effects. Kharis declined metformin for now and prescription was not written today. We will re-check labs in 3 months and Andrea [name] agreed to follow up with [place] as directed to monitor her progress.  Diabetes risk counseling Andrea [name] was given extended (15 minutes) diabetes prevention counseling today. She is 61 y.o. female and has risk factors for diabetes including obesity and insulin resistance. We discussed intensive lifestyle modifications today with an emphasis on weight loss as well as increasing exercise and decreasing simple carbohydrates in her diet.  Obesity Andrea [name] is currently in the action stage of change. As such, her goal is to continue with weight loss efforts She has agreed to follow the Category 2 plan Andrea [name] has been instructed to work up to a goal of 150 minutes of combined cardio and strengthening exercise per week for weight loss and overall health benefits. We discussed the  following Behavioral Modification Strategies today: no skipping meals, increasing lean protein intake, decreasing simple carbohydrates  and work on meal planning and easy cooking plans  Andrea [name] has agreed to follow up with our clinic in 2 weeks. She was informed of the importance of frequent follow up visits to maximize her success with intensive lifestyle modifications for her multiple health conditions.  I, Doreene Nest, am acting as transcriptionist for Andrea Nip, MD  I have reviewed the above documentation for accuracy and completeness, and I agree with the above. -Andrea Nip, MD    OBESITY BEHAVIORAL INTERVENTION VISIT  Today's visit was # 2 out of 67.  Starting weight: 211 lbs Starting date: 06/17/17 Today's weight : 204 lbs Today's date: 07/01/2017 Total lbs lost to date: 7 (Patients must lose 7 lbs in the first 6 months to continue with counseling)   ASK: We discussed the diagnosis of obesity with Andrea [name] today and Andrea [name] agreed to give [place] permission to discuss obesity behavioral modification therapy today.  ASSESS: Andrea [name] has the diagnosis of obesity and her BMI today is 36.15 Andrea [name] is in the action stage of change   ADVISE: Andrea [name] was educated on the multiple health risks of obesity as well as the benefit of weight loss to improve her health. She was advised of the need for long term treatment and the importance of lifestyle modifications.  AGREE: Multiple dietary modification options and treatment options were discussed and  Rashauna agreed to follow the Category 2 plan We discussed the following Behavioral Modification Strategies today: no skipping meals, increasing lean protein intake, decreasing simple carbohydrates  and work on meal planning and easy cooking plans

## 2017-07-15 ENCOUNTER — Ambulatory Visit (INDEPENDENT_AMBULATORY_CARE_PROVIDER_SITE_OTHER): Payer: 59 | Admitting: Family Medicine

## 2017-07-15 DIAGNOSIS — H524 Presbyopia: Secondary | ICD-10-CM | POA: Diagnosis not present

## 2017-07-15 DIAGNOSIS — H52223 Regular astigmatism, bilateral: Secondary | ICD-10-CM | POA: Diagnosis not present

## 2017-07-15 DIAGNOSIS — H2513 Age-related nuclear cataract, bilateral: Secondary | ICD-10-CM | POA: Diagnosis not present

## 2017-07-15 DIAGNOSIS — H43813 Vitreous degeneration, bilateral: Secondary | ICD-10-CM | POA: Diagnosis not present

## 2017-07-15 DIAGNOSIS — H04123 Dry eye syndrome of bilateral lacrimal glands: Secondary | ICD-10-CM | POA: Diagnosis not present

## 2017-07-16 ENCOUNTER — Ambulatory Visit (INDEPENDENT_AMBULATORY_CARE_PROVIDER_SITE_OTHER): Payer: 59 | Admitting: Family Medicine

## 2017-07-16 VITALS — BP 124/73 | HR 61 | Temp 98.0°F | Ht 63.0 in | Wt 203.0 lb

## 2017-07-16 DIAGNOSIS — E559 Vitamin D deficiency, unspecified: Secondary | ICD-10-CM

## 2017-07-16 DIAGNOSIS — E669 Obesity, unspecified: Secondary | ICD-10-CM

## 2017-07-16 DIAGNOSIS — Z6836 Body mass index (BMI) 36.0-36.9, adult: Secondary | ICD-10-CM | POA: Diagnosis not present

## 2017-07-16 DIAGNOSIS — Z9189 Other specified personal risk factors, not elsewhere classified: Secondary | ICD-10-CM

## 2017-07-16 DIAGNOSIS — IMO0001 Reserved for inherently not codable concepts without codable children: Secondary | ICD-10-CM

## 2017-07-16 MED ORDER — VITAMIN D (ERGOCALCIFEROL) 1.25 MG (50000 UNIT) PO CAPS: 50000.0000 [IU] | ORAL_CAPSULE | ORAL | 0 refills | Status: DC

## 2017-07-17 NOTE — Progress Notes (Signed)
Office: [phone]  /  Fax: [phone]   HPI:   Chief Complaint: OBESITY Andrea [name] is here to discuss her progress with her obesity treatment plan. She is on the Category 2 plan and is following her eating plan approximately 90 % of the time. She states she is exercising 0 minutes 0 times per week. Andrea [name] continues to do well with weight loss on the category 2 plan and she is making better choices. 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 Her weight is 203 lb (92.1 kg) today and has had a weight loss of 2 pounds over a period of 2 weeks since her last visit. She has lost 8 lbs since starting treatment with [place].  Vitamin D deficiency Andrea [name] has a diagnosis of vitamin D deficiency. She is currently taking vit D and is not yet at goal. Fatigue is slowly improving and she denies nausea, vomiting or muscle weakness.  At risk for osteopenia and osteoporosis Andrea [name] is at higher risk of osteopenia and osteoporosis due to vitamin D deficiency.   ALLERGIES: Allergies  Allergen Reactions  . Pork-Derived Metallurgist  . Budesonide Other (See Comments)    Per patch test, pt has never had medication.    MEDICATIONS: Current Outpatient Prescriptions on File Prior to Visit  Medication Sig Dispense Refill  . diclofenac sodium (VOLTAREN) 1 % GEL Apply topically as needed.     . valsartan-hydrochlorothiazide (DIOVAN-HCT) 80-12.5 MG per tablet Take 1 tablet by mouth daily.     Current Facility-Administered Medications on File Prior to Visit  Medication Dose Route Frequency Provider Last Rate Last Dose  . predniSONE (DELTASONE) tablet 10 mg  10 mg Oral UD Bobbitt, Sedalia Muta, MD        PAST MEDICAL HISTORY: Past Medical History:  Diagnosis Date  . Gallbladder problem   . GERD (gastroesophageal reflux disease)   . Hyperlipidemia   . Hypertension   . Joint pain   . Leg edema   . Multiple food allergies   . Obesity     PAST SURGICAL HISTORY: Past Surgical History:  Procedure Laterality Date  .  APPENDECTOMY    . CESAREAN SECTION    . HERNIA REPAIR    . LAPAROSCOPIC CHOLECYSTECTOMY    . POSTERIOR TIBIAL TENDON REPAIR    . TONSILECTOMY, ADENOIDECTOMY, BILATERAL MYRINGOTOMY AND TUBES  1983    SOCIAL HISTORY: Social History  Substance Use Topics  . Smoking status: Never Smoker  . Smokeless tobacco: Never Used  . Alcohol use No    FAMILY HISTORY: Family History  Problem Relation Age of Onset  . Asthma Son   . Stroke Mother   . Thyroid disease Mother   . Liver disease Father   . Alcoholism Father     ROS: Review of Systems  Constitutional: Positive for malaise/fatigue and weight loss.  Gastrointestinal: Negative for nausea and vomiting.  Musculoskeletal:       Negative muscle weakness    PHYSICAL EXAM: Blood pressure 124/73, pulse 61, temperature 98 F (36.7 C), temperature source Oral, height 5\' 3"  (1.6 m), weight 203 lb (92.1 kg), last menstrual period 05/30/2011, SpO2 97 %. Body mass index is 35.96 kg/m. Physical Exam  Constitutional: She is oriented to person, place, and time. She appears well-developed and well-nourished.  Cardiovascular: Normal rate.   Pulmonary/Chest: Effort normal.  Musculoskeletal: Normal range of motion.  Neurological: She is oriented to person, place, and time.  Skin: Skin is warm and dry.  Psychiatric: She has a normal mood and affect.  Her behavior is normal.  Vitals reviewed.   RECENT LABS AND TESTS: BMET    Component Value Date/Time   NA 140 06/17/2017 0940   K 4.2 06/17/2017 0940   CL 104 06/17/2017 0940   CO2 20 06/17/2017 0940   GLUCOSE 89 06/17/2017 0940   GLUCOSE 108 (H) 01/16/2016 1622   BUN 13 06/17/2017 0940   CREATININE 0.61 06/17/2017 0940   CREATININE 0.51 01/16/2016 1622   CALCIUM 9.1 06/17/2017 0940   GFRNONAA 98 06/17/2017 0940   GFRAA 113 06/17/2017 0940   Lab Results  Component Value Date   HGBA1C 5.6 06/17/2017   Lab Results  Component Value Date   INSULIN 14.3 06/17/2017   CBC      Component Value Date/Time   WBC 6.7 06/17/2017 0940   WBC 9.4 01/16/2016 1622   RBC 4.78 06/17/2017 0940   RBC 4.40 01/16/2016 1622   HGB 14.1 06/17/2017 0940   HCT 43.7 06/17/2017 0940   PLT 282 06/17/2017 0940   MCV 91 06/17/2017 0940   MCH 29.5 06/17/2017 0940   MCH 30.0 01/16/2016 1622   MCHC 32.3 06/17/2017 0940   MCHC 33.6 01/16/2016 1622   RDW 14.7 06/17/2017 0940   LYMPHSABS 2.0 06/17/2017 0940   MONOABS 0.9 01/16/2016 1622   EOSABS 0.1 06/17/2017 0940   BASOSABS 0.0 06/17/2017 0940   Iron/TIBC/Ferritin/ %Sat No results found for: IRON, TIBC, FERRITIN, IRONPCTSAT Lipid Panel     Component Value Date/Time   CHOL 181 06/17/2017 0940   TRIG 109 06/17/2017 0940   HDL 60 06/17/2017 0940   LDLCALC 99 06/17/2017 0940   Hepatic Function Panel     Component Value Date/Time   PROT 7.0 06/17/2017 0940   ALBUMIN 4.1 06/17/2017 0940   AST 21 06/17/2017 0940   ALT 21 06/17/2017 0940   ALKPHOS 83 06/17/2017 0940   BILITOT 0.7 06/17/2017 0940      Component Value Date/Time   TSH 1.230 06/17/2017 0940   TSH 1.15 05/15/2017   TSH 1.20 01/16/2016 1622    ASSESSMENT AND PLAN: Vitamin D deficiency - Plan: Vitamin D, Ergocalciferol, (DRISDOL) 50000 units CAPS capsule  At risk for osteoporosis  Class 2 obesity with serious comorbidity and body mass index (BMI) of 36.0 to 36.9 in adult, unspecified obesity type  PLAN:  Vitamin D Deficiency Andrea [name] was informed that low vitamin D levels contributes to fatigue and are associated with obesity, breast, and colon cancer. She agrees to continue to take prescription Vit D @50 ,000 IU every week, we will refill for 1 month and will follow up for routine testing of vitamin D, at least 2-3 times per year. She was informed of the risk of over-replacement of vitamin D and agrees to not increase her dose unless he discusses this with [place] first. Andrea [name] agrees to follow up with our clinic in 2 to 3 weeks.  At risk for osteopenia and  osteoporosis Andrea [name] is at risk for osteopenia and osteoporosis due to her vitamin D deficiency. She was encouraged to take her vitamin D and follow her higher calcium diet and increase strengthening exercise to help strengthen her bones and decrease her risk of osteopenia and osteoporosis.  Obesity Andrea [name] is currently in the action stage of change. As such, her goal is to continue with weight loss efforts She has agreed to follow the Category 2 plan Andrea [name] has been instructed to work up to a goal of 150 minutes of combined cardio and strengthening exercise per week for  weight loss and overall health benefits. We discussed the following Behavioral Modification Strategies today: keeping healthy foods in the home, increasing lean protein intake, decreasing simple carbohydrates , increasing vegetables and work on meal planning and easy cooking plans  Andrea [name] has agreed to follow up with our clinic in 2 to 3 weeks. She was informed of the importance of frequent follow up visits to maximize her success with intensive lifestyle modifications for her multiple health conditions.  I, Trixie Dredge, am acting as transcriptionist for Dennard Nip, MD  I have reviewed the above documentation for accuracy and completeness, and I agree with the above. -Dennard Nip, MD

## 2017-07-22 ENCOUNTER — Other Ambulatory Visit (INDEPENDENT_AMBULATORY_CARE_PROVIDER_SITE_OTHER): Payer: Self-pay | Admitting: Family Medicine

## 2017-07-22 DIAGNOSIS — E559 Vitamin D deficiency, unspecified: Secondary | ICD-10-CM

## 2017-07-23 MED FILL — VIT D2 1.25 MG (50,000 UNIT: 1.25 MG | 28 days supply | Qty: 4 | Fill #0

## 2017-07-30 ENCOUNTER — Ambulatory Visit (INDEPENDENT_AMBULATORY_CARE_PROVIDER_SITE_OTHER): Payer: 59 | Admitting: Physician Assistant

## 2017-07-30 VITALS — BP 125/76 | HR 59 | Temp 97.9°F | Ht 63.0 in | Wt 200.0 lb

## 2017-07-30 DIAGNOSIS — E8881 Metabolic syndrome: Secondary | ICD-10-CM

## 2017-07-30 DIAGNOSIS — Z9189 Other specified personal risk factors, not elsewhere classified: Secondary | ICD-10-CM | POA: Diagnosis not present

## 2017-07-30 DIAGNOSIS — Z6835 Body mass index (BMI) 35.0-35.9, adult: Secondary | ICD-10-CM

## 2017-07-30 DIAGNOSIS — K5909 Other constipation: Secondary | ICD-10-CM

## 2017-07-30 MED ORDER — POLYETHYLENE GLYCOL 3350 17 GM/SCOOP PO POWD: 17.0000 g | Freq: Every day | ORAL | 0 refills | Status: DC

## 2017-07-31 NOTE — Progress Notes (Addendum)
Office: [phone]  /  Fax: [phone]   HPI:   Chief Complaint: OBESITY Andrea [name] is here to discuss her progress with her obesity treatment plan. She is on the Category 2 plan and is following her eating plan approximately 90 % of the time. She states she is exercising 0 minutes 0 times per week. Andrea [name] continues to do well with weight loss. She plans her meals ahead well and follows the plan. She states hunger is well controlled. Her weight is 200 lb (90.7 kg) today and has had a weight loss of 3 pounds over a period of 2 weeks since her last visit. She has lost 11 lbs since starting treatment with [place].  Constipation Andrea [name] notes constipation for the last few weeks, worse since attempting weight loss. She states BM are less frequent and are not hard and painful. She denies hematochezia or melena.   Insulin Resistance Andrea [name] has a diagnosis of insulin resistance based on her elevated fasting insulin level >5. Although Andrea [name]'s blood glucose readings are still under good control, insulin resistance puts her at greater risk of metabolic syndrome and diabetes. She is not taking metformin currently and continues to work on diet and exercise to decrease risk of diabetes. Andrea [name] denies polyphagia.  At risk for diabetes Andrea [name] is at higher than average risk for developing diabetes due to her obesity and insulin resistance. She currently denies polyuria or polydipsia.   ALLERGIES: Allergies  Allergen Reactions  . Pork-Derived Metallurgist  . Budesonide Other (See Comments)    Per patch test, pt has never had medication.    MEDICATIONS: Current Outpatient Prescriptions on File Prior to Visit  Medication Sig Dispense Refill  . valsartan-hydrochlorothiazide (DIOVAN-HCT) 80-12.5 MG per tablet Take 1 tablet by mouth daily.    . Vitamin D, Ergocalciferol, (DRISDOL) 50000 units CAPS capsule Take 1 capsule (50,000 Units total) by mouth every 7 (seven) days. 4 capsule 0   Current  Facility-Administered Medications on File Prior to Visit  Medication Dose Route Frequency Provider Last Rate Last Dose  . predniSONE (DELTASONE) tablet 10 mg  10 mg Oral UD Bobbitt, [place] Muta, MD        PAST MEDICAL HISTORY: Past Medical History:  Diagnosis Date  . Gallbladder problem   . GERD (gastroesophageal reflux disease)   . Hyperlipidemia   . Hypertension   . Joint pain   . Leg edema   . Multiple food allergies   . Obesity     PAST SURGICAL HISTORY: Past Surgical History:  Procedure Laterality Date  . APPENDECTOMY    . CESAREAN SECTION    . HERNIA REPAIR    . LAPAROSCOPIC CHOLECYSTECTOMY    . POSTERIOR TIBIAL TENDON REPAIR    . TONSILECTOMY, ADENOIDECTOMY, BILATERAL MYRINGOTOMY AND TUBES  1983    SOCIAL HISTORY: Social History  Substance Use Topics  . Smoking status: Never Smoker  . Smokeless tobacco: Never Used  . Alcohol use No    FAMILY HISTORY: Family History  Problem Relation Age of Onset  . Asthma Son   . Stroke Mother   . Thyroid disease Mother   . Liver disease Father   . Alcoholism Father     ROS: Review of Systems  Constitutional: Positive for weight loss.  Gastrointestinal: Negative for melena.       Negative hematochezia  Genitourinary: Negative for frequency.  Endo/Heme/Allergies: Negative for polydipsia.       Negative polyphagia    PHYSICAL EXAM: Blood pressure 125/76, pulse (!) 59, temperature  97.9 F (36.6 C), temperature source Oral, height 5\' 3"  (1.6 m), weight 200 lb (90.7 kg), last menstrual period 05/30/2011, SpO2 97 %. Body mass index is 35.43 kg/m. Physical Exam  Constitutional: She is oriented to person, place, and time. She appears well-developed and well-nourished.  Cardiovascular:  Bradycardic  Pulmonary/Chest: Effort normal.  Musculoskeletal: Normal range of motion.  Neurological: She is oriented to person, place, and time.  Skin: Skin is warm and dry.  Psychiatric: She has a normal mood and affect. Her  behavior is normal.  Vitals reviewed.   RECENT LABS AND TESTS: BMET    Component Value Date/Time   NA 140 06/17/2017 0940   K 4.2 06/17/2017 0940   CL 104 06/17/2017 0940   CO2 20 06/17/2017 0940   GLUCOSE 89 06/17/2017 0940   GLUCOSE 108 (H) 01/16/2016 1622   BUN 13 06/17/2017 0940   CREATININE 0.61 06/17/2017 0940   CREATININE 0.51 01/16/2016 1622   CALCIUM 9.1 06/17/2017 0940   GFRNONAA 98 06/17/2017 0940   GFRAA 113 06/17/2017 0940   Lab Results  Component Value Date   HGBA1C 5.6 06/17/2017   Lab Results  Component Value Date   INSULIN 14.3 06/17/2017   CBC    Component Value Date/Time   WBC 6.7 06/17/2017 0940   WBC 9.4 01/16/2016 1622   RBC 4.78 06/17/2017 0940   RBC 4.40 01/16/2016 1622   HGB 14.1 06/17/2017 0940   HCT 43.7 06/17/2017 0940   PLT 282 06/17/2017 0940   MCV 91 06/17/2017 0940   MCH 29.5 06/17/2017 0940   MCH 30.0 01/16/2016 1622   MCHC 32.3 06/17/2017 0940   MCHC 33.6 01/16/2016 1622   RDW 14.7 06/17/2017 0940   LYMPHSABS 2.0 06/17/2017 0940   MONOABS 0.9 01/16/2016 1622   EOSABS 0.1 06/17/2017 0940   BASOSABS 0.0 06/17/2017 0940   Iron/TIBC/Ferritin/ %Sat No results found for: IRON, TIBC, FERRITIN, IRONPCTSAT Lipid Panel     Component Value Date/Time   CHOL 181 06/17/2017 0940   TRIG 109 06/17/2017 0940   HDL 60 06/17/2017 0940   LDLCALC 99 06/17/2017 0940   Hepatic Function Panel     Component Value Date/Time   PROT 7.0 06/17/2017 0940   ALBUMIN 4.1 06/17/2017 0940   AST 21 06/17/2017 0940   ALT 21 06/17/2017 0940   ALKPHOS 83 06/17/2017 0940   BILITOT 0.7 06/17/2017 0940      Component Value Date/Time   TSH 1.230 06/17/2017 0940   TSH 1.15 05/15/2017   TSH 1.20 01/16/2016 1622    ASSESSMENT AND PLAN: Other constipation - Plan: polyethylene glycol powder (GLYCOLAX/MIRALAX) powder  Insulin resistance  At risk for diabetes mellitus  Class 2 severe obesity with serious comorbidity and body mass index (BMI) of 35.0  to 35.9 in adult, unspecified obesity type (Andrea [name])  PLAN:  Constipation Andrea [name] was informed decrease bowel movement frequency is normal while losing weight, but stools should not be hard or painful. She was advised to increase her H20 intake and work on increasing her fiber intake. High fiber foods were discussed today. Andrea [name] agrees to start Miralax 1 capful daily and will follow up with our clinic in 2 weeks.  Insulin Resistance Andrea [name] will continue to work on weight loss, exercise, and decreasing simple carbohydrates in her diet to help decrease the risk of diabetes. She was informed that eating too many simple carbohydrates or too many calories at one sitting increases the likelihood of GI side effects. Andrea [name] agreed to follow  up with [place] as directed to monitor her progress.  Diabetes risk counseling Andrea [name] was given extended (15 minutes) diabetes prevention counseling today. She is 61 y.o. female and has risk factors for diabetes including obesity and insulin resistance. We discussed intensive lifestyle modifications today with an emphasis on weight loss as well as increasing exercise and decreasing simple carbohydrates in her diet.  Obesity Felesia is currently in the action stage of change. As such, her goal is to continue with weight loss efforts She has agreed to follow the Category 2 plan Andrea [name] has been instructed to work up to a goal of 150 minutes of combined cardio and strengthening exercise per week for weight loss and overall health benefits. We discussed the following Behavioral Modification Strategies today: increasing lean protein intake and work on meal planning and easy cooking plans  Yasmen has agreed to follow up with our clinic in 2 weeks. She was informed of the importance of frequent follow up visits to maximize her success with intensive lifestyle modifications for her multiple health conditions.  I, Doreene Nest, am acting as transcriptionist for Andrea Duverney, PA-C  I have  reviewed the above documentation for accuracy and completeness, and I agree with the above. -Andrea Duverney, PA-C  I have reviewed the above note and agree with the plan. -Andrea Nip, MD   OBESITY BEHAVIORAL INTERVENTION VISIT  Today's visit was # 4 out of 22.  Starting weight: 211 lbs Starting date: 06/17/17 Today's weight : 200 lbs Today's date: 07/30/2017 Total lbs lost to date: 58 (Patients must lose 7 lbs in the first 6 months to continue with counseling)   ASK: We discussed the diagnosis of obesity with Andrea [name] today and Andrea [name] agreed to give [place] permission to discuss obesity behavioral modification therapy today.  ASSESS: Andrea [name] has the diagnosis of obesity and her BMI today is 35.44 Andrea [name] is in the action stage of change   ADVISE: Andrea [name] was educated on the multiple health risks of obesity as well as the benefit of weight loss to improve her health. She was advised of the need for long term treatment and the importance of lifestyle modifications.  AGREE: Multiple dietary modification options and treatment options were discussed and  Andrea [name] agreed to follow the Category 2 plan We discussed the following Behavioral Modification Strategies today: increasing lean protein intake and work on meal planning and easy cooking plans

## 2017-08-13 ENCOUNTER — Ambulatory Visit (INDEPENDENT_AMBULATORY_CARE_PROVIDER_SITE_OTHER): Payer: 59 | Admitting: Physician Assistant

## 2017-08-13 VITALS — BP 126/77 | HR 64 | Temp 98.4°F | Ht 63.0 in | Wt 198.0 lb

## 2017-08-13 DIAGNOSIS — I10 Essential (primary) hypertension: Secondary | ICD-10-CM | POA: Diagnosis not present

## 2017-08-13 DIAGNOSIS — Z6835 Body mass index (BMI) 35.0-35.9, adult: Secondary | ICD-10-CM

## 2017-08-13 NOTE — Progress Notes (Signed)
Office: [phone]  /  Fax: [phone]   HPI:   Chief Complaint: OBESITY Andrea [name] is here to discuss her progress with her obesity treatment plan. She is on the Category 2 plan and is following her eating plan approximately 90 % of the time. She states she is exercising 0 minutes 0 times per week. Andrea [name] continues to dso well with weight loss. She plans her meals well and she states her hunger is not well controlled. She would like to have more variety at dinner.  Her weight is 198 lb (89.8 kg) today and has had a weight loss of 2 pounds over a period of 2 weeks since her last visit. She has lost 13 lbs since starting treatment with [place].  Hypertension Andrea [name] is a 61 y.o. female with hypertension. Andrea [name] denies chest pain or shortness of breath. She is working weight loss to help control her blood pressure with the goal of decreasing her risk of heart attack and stroke. Andrea [name]'s blood pressure is stable.  ALLERGIES: Allergies  Allergen Reactions  . Pork-Derived Metallurgist  . Budesonide Other (See Comments)    Per patch test, pt has never had medication.    MEDICATIONS: Current Outpatient Prescriptions on File Prior to Visit  Medication Sig Dispense Refill  . valsartan-hydrochlorothiazide (DIOVAN-HCT) 80-12.5 MG per tablet Take 1 tablet by mouth daily.    . Vitamin D, Ergocalciferol, (DRISDOL) 50000 units CAPS capsule Take 1 capsule (50,000 Units total) by mouth every 7 (seven) days. 4 capsule 0   Current Facility-Administered Medications on File Prior to Visit  Medication Dose Route Frequency Provider Last Rate Last Dose  . predniSONE (DELTASONE) tablet 10 mg  10 mg Oral UD Bobbitt, [place] Muta, MD        PAST MEDICAL HISTORY: Past Medical History:  Diagnosis Date  . Gallbladder problem   . GERD (gastroesophageal reflux disease)   . Hyperlipidemia   . Hypertension   . Joint pain   . Leg edema   . Multiple food allergies   . Obesity     PAST SURGICAL  HISTORY: Past Surgical History:  Procedure Laterality Date  . APPENDECTOMY    . CESAREAN SECTION    . HERNIA REPAIR    . LAPAROSCOPIC CHOLECYSTECTOMY    . POSTERIOR TIBIAL TENDON REPAIR    . TONSILECTOMY, ADENOIDECTOMY, BILATERAL MYRINGOTOMY AND TUBES  1983    SOCIAL HISTORY: Social History  Substance Use Topics  . Smoking status: Never Smoker  . Smokeless tobacco: Never Used  . Alcohol use No    FAMILY HISTORY: Family History  Problem Relation Age of Onset  . Asthma Son   . Stroke Mother   . Thyroid disease Mother   . Liver disease Father   . Alcoholism Father     ROS: Review of Systems  Constitutional: Positive for weight loss.  Respiratory: Negative for shortness of breath.   Cardiovascular: Negative for chest pain.    PHYSICAL EXAM: Blood pressure 126/77, pulse 64, temperature 98.4 F (36.9 C), temperature source Oral, height 5\' 3"  (1.6 m), weight 198 lb (89.8 kg), last menstrual period 05/30/2011, SpO2 97 %. Body mass index is 35.07 kg/m. Physical Exam  Constitutional: She is oriented to person, place, and time. She appears well-developed and well-nourished.  Cardiovascular: Normal rate.   Pulmonary/Chest: Effort normal.  Musculoskeletal: Normal range of motion.  Neurological: She is oriented to person, place, and time.  Skin: Skin is warm and dry.  Psychiatric: She has a  normal mood and affect. Her behavior is normal.  Vitals reviewed.   RECENT LABS AND TESTS: BMET    Component Value Date/Time   NA 140 06/17/2017 0940   K 4.2 06/17/2017 0940   CL 104 06/17/2017 0940   CO2 20 06/17/2017 0940   GLUCOSE 89 06/17/2017 0940   GLUCOSE 108 (H) 01/16/2016 1622   BUN 13 06/17/2017 0940   CREATININE 0.61 06/17/2017 0940   CREATININE 0.51 01/16/2016 1622   CALCIUM 9.1 06/17/2017 0940   GFRNONAA 98 06/17/2017 0940   GFRAA 113 06/17/2017 0940   Lab Results  Component Value Date   HGBA1C 5.6 06/17/2017   Lab Results  Component Value Date   INSULIN  14.3 06/17/2017   CBC    Component Value Date/Time   WBC 6.7 06/17/2017 0940   WBC 9.4 01/16/2016 1622   RBC 4.78 06/17/2017 0940   RBC 4.40 01/16/2016 1622   HGB 14.1 06/17/2017 0940   HCT 43.7 06/17/2017 0940   PLT 282 06/17/2017 0940   MCV 91 06/17/2017 0940   MCH 29.5 06/17/2017 0940   MCH 30.0 01/16/2016 1622   MCHC 32.3 06/17/2017 0940   MCHC 33.6 01/16/2016 1622   RDW 14.7 06/17/2017 0940   LYMPHSABS 2.0 06/17/2017 0940   MONOABS 0.9 01/16/2016 1622   EOSABS 0.1 06/17/2017 0940   BASOSABS 0.0 06/17/2017 0940   Iron/TIBC/Ferritin/ %Sat No results found for: IRON, TIBC, FERRITIN, IRONPCTSAT Lipid Panel     Component Value Date/Time   CHOL 181 06/17/2017 0940   TRIG 109 06/17/2017 0940   HDL 60 06/17/2017 0940   LDLCALC 99 06/17/2017 0940   Hepatic Function Panel     Component Value Date/Time   PROT 7.0 06/17/2017 0940   ALBUMIN 4.1 06/17/2017 0940   AST 21 06/17/2017 0940   ALT 21 06/17/2017 0940   ALKPHOS 83 06/17/2017 0940   BILITOT 0.7 06/17/2017 0940      Component Value Date/Time   TSH 1.230 06/17/2017 0940   TSH 1.15 05/15/2017   TSH 1.20 01/16/2016 1622    ASSESSMENT AND PLAN: Essential hypertension  Class 2 severe obesity with serious comorbidity and body mass index (BMI) of 35.0 to 35.9 in adult, unspecified obesity type ([place])  PLAN:  Hypertension We discussed sodium restriction, working on healthy weight loss, and a regular exercise program as the means to achieve improved blood pressure control. Andrea [name] agreed with this plan and agreed to follow up as directed. We will continue to monitor her blood pressure as well as her progress with the above lifestyle modifications. She will continue her medications as prescribed and will watch for signs of hypotension as she continues her lifestyle modifications. Andrea [name] agrees to follow up with our clinic in 2 weeks.  We spent > than 50% of the 15 minute visit on the counseling as documented in the  note.  Obesity Andrea [name] is currently in the action stage of change. As such, her goal is to continue with weight loss efforts She has agreed to change to keep a food journal with 500 calories and 40 grams of protein at supper daily and follow the Category 2 plan Andrea [name] has been instructed to work up to a goal of 150 minutes of combined cardio and strengthening exercise per week for weight loss and overall health benefits. We discussed the following Behavioral Modification Strategies today: increasing lean protein intake and keep a strict food journal   Andrea [name] has agreed to follow up with our clinic in 2  weeks. She was informed of the importance of frequent follow up visits to maximize her success with intensive lifestyle modifications for her multiple health conditions.  I, Trixie Dredge, am acting as transcriptionist for Andrea Duverney, PA-C  I have reviewed the above documentation for accuracy and completeness, and I agree with the above. -Andrea Duverney, PA-C  I have reviewed the above note and agree with the plan. -Andrea Nip, MD     Today's visit was # 5 out of 22.  Starting weight: 211 lbs Starting date: 06/17/17 Today's weight : 198 lbs  Today's date: 08/13/2017 Total lbs lost to date: 13 (Patients must lose 7 lbs in the first 6 months to continue with counseling)   ASK: We discussed the diagnosis of obesity with Andrea [name] today and Andrea [name] agreed to give [place] permission to discuss obesity behavioral modification therapy today.  ASSESS: Andrea [name] has the diagnosis of obesity and her BMI today is 35.08 Andrea [name] is in the action stage of change   ADVISE: Andrea [name] was educated on the multiple health risks of obesity as well as the benefit of weight loss to improve her health. She was advised of the need for long term treatment and the importance of lifestyle modifications.  AGREE: Multiple dietary modification options and treatment options were discussed and  Andrea [name] agreed to keep a food  journal with 500 calories and 40 grams of protein at supper daily and follow the Category 2 plan We discussed the following Behavioral Modification Strategies today: increasing lean protein intake and keep a strict food journal

## 2017-08-27 ENCOUNTER — Ambulatory Visit (INDEPENDENT_AMBULATORY_CARE_PROVIDER_SITE_OTHER): Payer: 59 | Admitting: Physician Assistant

## 2017-08-27 VITALS — BP 123/75 | HR 61 | Temp 98.0°F | Ht 63.0 in | Wt 196.0 lb

## 2017-08-27 DIAGNOSIS — Z6834 Body mass index (BMI) 34.0-34.9, adult: Secondary | ICD-10-CM | POA: Diagnosis not present

## 2017-08-27 DIAGNOSIS — Z9189 Other specified personal risk factors, not elsewhere classified: Secondary | ICD-10-CM

## 2017-08-27 DIAGNOSIS — E669 Obesity, unspecified: Secondary | ICD-10-CM

## 2017-08-27 DIAGNOSIS — E559 Vitamin D deficiency, unspecified: Secondary | ICD-10-CM | POA: Diagnosis not present

## 2017-08-27 DIAGNOSIS — I10 Essential (primary) hypertension: Secondary | ICD-10-CM | POA: Diagnosis not present

## 2017-08-27 MED ORDER — VITAMIN D (ERGOCALCIFEROL) 1.25 MG (50000 UNIT) PO CAPS: 50000.0000 [IU] | ORAL_CAPSULE | ORAL | 0 refills | Status: DC

## 2017-08-27 NOTE — Progress Notes (Signed)
Office: [phone]  /  Fax: [phone]   HPI:   Chief Complaint: OBESITY Andrea [name] is here to discuss her progress with her obesity treatment plan. She is on the Category 2 plan and is following her eating plan approximately 90 % of the time. She states she is exercising 0 minutes 0 times per week. Andrea [name] continues to do well with weight loss. She has been incorporating variety to her meals and would like more meal planning ideas. Her weight is 196 lb (88.9 kg) today and has had a weight loss of 2 pounds over a period of 2 weeks since her last visit. She has lost 15 lbs since starting treatment with [place].  Vitamin D deficiency Andrea [name] has a diagnosis of vitamin D deficiency. She is currently taking vit D and denies nausea, vomiting or muscle weakness.  Hypertension Andrea [name] is a 61 y.o. female with hypertension.  Andrea [name] denies chest pain or shortness of breath on exertion. She is working weight loss to help control her blood [name] with the goal of decreasing her risk of heart attack and stroke. Andrea [name] is currently stable.  At risk for cardiovascular disease Andrea [name] is at a higher than average risk for cardiovascular disease due to obesity and hypertension. She currently denies any chest pain.   ALLERGIES: Allergies  Allergen Reactions  . Pork-Derived Metallurgist  . Budesonide Other (See Comments)    Per patch test, pt has never had medication.    MEDICATIONS: Current Outpatient Medications on File Prior to Visit  Medication Sig Dispense Refill  . valsartan-hydrochlorothiazide (DIOVAN-HCT) 80-12.5 MG per tablet Take 1 tablet by mouth daily.    . Vitamin D, Ergocalciferol, (DRISDOL) 50000 units CAPS capsule Take 1 capsule (50,000 Units total) by mouth every 7 (seven) days. 4 capsule 0   No current facility-administered medications on file prior to visit.     PAST MEDICAL HISTORY: Past Medical History:  Diagnosis Date  . Gallbladder problem   . GERD  (gastroesophageal reflux disease)   . Hyperlipidemia   . Hypertension   . Joint pain   . Leg edema   . Multiple food allergies   . Obesity     PAST SURGICAL HISTORY: Past Surgical History:  Procedure Laterality Date  . APPENDECTOMY    . CESAREAN SECTION    . HERNIA REPAIR    . LAPAROSCOPIC CHOLECYSTECTOMY    . POSTERIOR TIBIAL TENDON REPAIR    . TONSILECTOMY, ADENOIDECTOMY, BILATERAL MYRINGOTOMY AND TUBES  1983    SOCIAL HISTORY: Social History   Tobacco Use  . Smoking status: Never Smoker  . Smokeless tobacco: Never Used  Substance Use Topics  . Alcohol use: No    Alcohol/week: 0.0 oz  . Drug use: No    FAMILY HISTORY: Family History  Problem Relation Age of Onset  . Asthma Son   . Stroke Mother   . Thyroid disease Mother   . Liver disease Father   . Alcoholism Father     ROS: Review of Systems  Constitutional: Positive for weight loss.  Respiratory: Negative for shortness of breath (on exertion).   Cardiovascular: Negative for chest pain.  Gastrointestinal: Negative for nausea and vomiting.  Musculoskeletal:       Negative muscle weakness    PHYSICAL EXAM: Blood [name] 123/75, pulse 61, temperature 98 F (36.7 C), temperature source Oral, height 5\' 3"  (1.6 m), weight 196 lb (88.9 kg), last menstrual period 05/30/2011, SpO2 98 %. Body mass index is  34.72 kg/m. Physical Exam  Constitutional: She is oriented to person, place, and time. She appears well-developed and well-nourished.  Cardiovascular: Normal rate.  Pulmonary/Chest: Effort normal.  Musculoskeletal: Normal range of motion.  Neurological: She is oriented to person, place, and time.  Skin: Skin is warm and dry.  Psychiatric: She has a normal mood and affect. Her behavior is normal.  Vitals reviewed.   RECENT LABS AND TESTS: BMET    Component Value Date/Time   NA 140 06/17/2017 0940   K 4.2 06/17/2017 0940   CL 104 06/17/2017 0940   CO2 20 06/17/2017 0940   GLUCOSE 89 06/17/2017  0940   GLUCOSE 108 (H) 01/16/2016 1622   BUN 13 06/17/2017 0940   CREATININE 0.61 06/17/2017 0940   CREATININE 0.51 01/16/2016 1622   CALCIUM 9.1 06/17/2017 0940   GFRNONAA 98 06/17/2017 0940   GFRAA 113 06/17/2017 0940   Lab Results  Component Value Date   HGBA1C 5.6 06/17/2017   Lab Results  Component Value Date   INSULIN 14.3 06/17/2017   CBC    Component Value Date/Time   WBC 6.7 06/17/2017 0940   WBC 9.4 01/16/2016 1622   RBC 4.78 06/17/2017 0940   RBC 4.40 01/16/2016 1622   HGB 14.1 06/17/2017 0940   HCT 43.7 06/17/2017 0940   PLT 282 06/17/2017 0940   MCV 91 06/17/2017 0940   MCH 29.5 06/17/2017 0940   MCH 30.0 01/16/2016 1622   MCHC 32.3 06/17/2017 0940   MCHC 33.6 01/16/2016 1622   RDW 14.7 06/17/2017 0940   LYMPHSABS 2.0 06/17/2017 0940   MONOABS 0.9 01/16/2016 1622   EOSABS 0.1 06/17/2017 0940   BASOSABS 0.0 06/17/2017 0940   Iron/TIBC/Ferritin/ %Sat No results found for: IRON, TIBC, FERRITIN, IRONPCTSAT Lipid Panel     Component Value Date/Time   CHOL 181 06/17/2017 0940   TRIG 109 06/17/2017 0940   HDL 60 06/17/2017 0940   LDLCALC 99 06/17/2017 0940   Hepatic Function Panel     Component Value Date/Time   PROT 7.0 06/17/2017 0940   ALBUMIN 4.1 06/17/2017 0940   AST 21 06/17/2017 0940   ALT 21 06/17/2017 0940   ALKPHOS 83 06/17/2017 0940   BILITOT 0.7 06/17/2017 0940      Component Value Date/Time   TSH 1.230 06/17/2017 0940   TSH 1.15 05/15/2017   TSH 1.20 01/16/2016 1622    ASSESSMENT AND PLAN: Vitamin D deficiency - Plan: Vitamin D, Ergocalciferol, (DRISDOL) 50000 units CAPS capsule  Essential hypertension  At risk for heart disease  Class 1 obesity with serious comorbidity and body mass index (BMI) of 34.0 to 34.9 in adult, unspecified obesity type  PLAN:  Vitamin D Deficiency Andrea [name] was informed that low vitamin D levels contributes to fatigue and are associated with obesity, breast, and colon cancer. She agrees to continue  to take prescription Vit D @50 ,000 IU every week #4 with no refills and will follow up for routine testing of vitamin D, at least 2-3 times per year. She was informed of the risk of over-replacement of vitamin D and agrees to not increase her dose unless he discusses this with [place] first. Andrea [name] agrees to follow up with our clinic in 2 weeks.  Hypertension We discussed sodium restriction, working on healthy weight loss, and a regular exercise program as the means to achieve improved blood [name] control. Andrea [name] agreed with this plan and agreed to follow up as directed. We will continue to monitor her blood [name] as well  as her progress with the above lifestyle modifications. She will continue her medications as prescribed and will watch for signs of hypotension as she continues her lifestyle modifications.  Cardiovascular risk counseling Andrea [name] was given extended (15 minutes) coronary artery disease prevention counseling today. She is 61 y.o. female and has risk factors for heart disease including obesity and hypertension. We discussed intensive lifestyle modifications today with an emphasis on specific weight loss instructions and strategies. Pt was also informed of the importance of increasing exercise and decreasing saturated fats to help prevent heart disease.  Obesity Andrea [name] is currently in the action stage of change. As such, her goal is to continue with weight loss efforts She has agreed to keep a food journal with 500 calories and 40 grams of protein at supper daily and follow the Category 2 plan Andrea [name] has been instructed to work up to a goal of 150 minutes of combined cardio and strengthening exercise per week for weight loss and overall health benefits. We discussed the following Behavioral Modification Strategies today: increasing lean protein intake and work on meal planning and easy cooking plans  Andrea [name] has agreed to follow up with our clinic in 2 weeks. She was informed of the importance  of frequent follow up visits to maximize her success with intensive lifestyle modifications for her multiple health conditions.  I, Andrea [name], am acting as transcriptionist for Andrea Duverney, PA-C  I have reviewed the above documentation for accuracy and completeness, and I agree with the above. -Andrea Duverney, PA-C  I have reviewed the above note and agree with the plan. -Andrea Nip, MD   OBESITY BEHAVIORAL INTERVENTION VISIT  Today's visit was # 6 out of 22.  Starting weight: 211 lbs Starting date: 06/17/17 Today's weight : 196 lbs Today's date: 08/27/2017 Total lbs lost to date: 15 (Patients must lose 7 lbs in the first 6 months to continue with counseling)   ASK: We discussed the diagnosis of obesity with Andrea [name] today and Andrea [name] agreed to give [place] permission to discuss obesity behavioral modification therapy today.  ASSESS: Andrea [name] has the diagnosis of obesity and her BMI today is 34.73 Andrea [name] is in the action stage of change   ADVISE: Andrea [name] was educated on the multiple health risks of obesity as well as the benefit of weight loss to improve her health. She was advised of the need for long term treatment and the importance of lifestyle modifications.  AGREE: Multiple dietary modification options and treatment options were discussed and  Andrea [name] agreed to keep a food journal with 500 calories and 40 grams of protein at supper daily and follow the Category 2 plan We discussed the following Behavioral Modification Strategies today: increasing lean protein intake and work on meal planning and easy cooking plans

## 2017-08-28 ENCOUNTER — Telehealth (INDEPENDENT_AMBULATORY_CARE_PROVIDER_SITE_OTHER): Payer: Self-pay | Admitting: Physician Assistant

## 2017-08-28 NOTE — Telephone Encounter (Signed)
Cancelled the prescription at [facility]. April, [place]

## 2017-08-28 NOTE — Telephone Encounter (Signed)
PATIENT'S PRESCRIPTIONS SENT TO INCORRECT  PHARMACY YESTERDAY.  THEY WERE SENT TO [facility] BUT NEEDED TO GO TO [facility].  SHE CALLED YESTERDAY TO LET OUR OFFICE KNOW.    TODAY, [place] THE PRESCRIPTIONS NEED CANCELLED AT [facility] SO THEY CAN NOW FILL THEM.  [place]

## 2017-09-02 MED FILL — VIT D2 1.25 MG (50,000 UNIT: 1.25 MG | 28 days supply | Qty: 4 | Fill #0

## 2017-09-10 ENCOUNTER — Ambulatory Visit (INDEPENDENT_AMBULATORY_CARE_PROVIDER_SITE_OTHER): Payer: 59 | Admitting: Physician Assistant

## 2017-09-10 VITALS — BP 121/75 | HR 62 | Temp 97.8°F | Ht 63.0 in | Wt 196.0 lb

## 2017-09-10 DIAGNOSIS — Z6834 Body mass index (BMI) 34.0-34.9, adult: Secondary | ICD-10-CM | POA: Diagnosis not present

## 2017-09-10 DIAGNOSIS — I10 Essential (primary) hypertension: Secondary | ICD-10-CM | POA: Diagnosis not present

## 2017-09-10 DIAGNOSIS — E669 Obesity, unspecified: Secondary | ICD-10-CM

## 2017-09-10 NOTE — Progress Notes (Signed)
Office: [phone]  /  Fax: [phone]   HPI:   Chief Complaint: OBESITY Andrea [name] is here to discuss her progress with her obesity treatment plan. She is on the keep a food journal with 500 calories and 40 grams of protein at supper daily and follow the Category 2 plan and is following her eating plan approximately 90 % of the time. She states she is exercising 0 minutes 0 times per week. Andrea [name] maintained her weight. She would like more variety with her meals. Also, she would like more holiday eating strategies.  Her weight is 196 lb (88.9 kg) today and has not lost weight since her last visit. She has lost 15 lbs since starting treatment with [place].  Hypertension Andrea [name] is a 61 y.o. female with hypertension. Andrea [name]'s blood pressure is stable and she denies chest pain or shortness of breath. She is working weight loss to help control her blood pressure with the goal of decreasing her risk of heart attack and stroke. Andrea [name]'s blood pressure is not currently controlled.  ALLERGIES: Allergies  Allergen Reactions  . Pork-Derived Metallurgist  . Budesonide Other (See Comments)    Per patch test, pt has never had medication.    MEDICATIONS: Current Outpatient Medications on File Prior to Visit  Medication Sig Dispense Refill  . valsartan-hydrochlorothiazide (DIOVAN-HCT) 80-12.5 MG per tablet Take 1 tablet by mouth daily.    . Vitamin D, Ergocalciferol, (DRISDOL) 50000 units CAPS capsule Take 1 capsule (50,000 Units total) every 7 (seven) days by mouth. 4 capsule 0   No current facility-administered medications on file prior to visit.     PAST MEDICAL HISTORY: Past Medical History:  Diagnosis Date  . Gallbladder problem   . GERD (gastroesophageal reflux disease)   . Hyperlipidemia   . Hypertension   . Joint pain   . Leg edema   . Multiple food allergies   . Obesity     PAST SURGICAL HISTORY: Past Surgical History:  Procedure Laterality Date  . APPENDECTOMY    .  CESAREAN SECTION    . HERNIA REPAIR    . LAPAROSCOPIC CHOLECYSTECTOMY    . POSTERIOR TIBIAL TENDON REPAIR    . TONSILECTOMY, ADENOIDECTOMY, BILATERAL MYRINGOTOMY AND TUBES  1983    SOCIAL HISTORY: Social History   Tobacco Use  . Smoking status: Never Smoker  . Smokeless tobacco: Never Used  Substance Use Topics  . Alcohol use: No    Alcohol/week: 0.0 oz  . Drug use: No    FAMILY HISTORY: Family History  Problem Relation Age of Onset  . Asthma Son   . Stroke Mother   . Thyroid disease Mother   . Liver disease Father   . Alcoholism Father     ROS: Review of Systems  Constitutional: Negative for weight loss.  Respiratory: Negative for shortness of breath.   Cardiovascular: Negative for chest pain.    PHYSICAL EXAM: Blood pressure 121/75, pulse 62, temperature 97.8 F (36.6 C), temperature source Oral, height 5\' 3"  (1.6 m), weight 196 lb (88.9 kg), last menstrual period 05/30/2011, SpO2 99 %. Body mass index is 34.72 kg/m. Physical Exam  Constitutional: She is oriented to person, place, and time. She appears well-developed and well-nourished.  Cardiovascular: Normal rate.  Pulmonary/Chest: Effort normal.  Musculoskeletal: Normal range of motion.  Neurological: She is oriented to person, place, and time.  Skin: Skin is warm and dry.  Psychiatric: She has a normal mood and affect. Her behavior is normal.  Vitals reviewed.  RECENT LABS AND TESTS: BMET    Component Value Date/Time   NA 140 06/17/2017 0940   K 4.2 06/17/2017 0940   CL 104 06/17/2017 0940   CO2 20 06/17/2017 0940   GLUCOSE 89 06/17/2017 0940   GLUCOSE 108 (H) 01/16/2016 1622   BUN 13 06/17/2017 0940   CREATININE 0.61 06/17/2017 0940   CREATININE 0.51 01/16/2016 1622   CALCIUM 9.1 06/17/2017 0940   GFRNONAA 98 06/17/2017 0940   GFRAA 113 06/17/2017 0940   Lab Results  Component Value Date   HGBA1C 5.6 06/17/2017   Lab Results  Component Value Date   INSULIN 14.3 06/17/2017   CBC      Component Value Date/Time   WBC 6.7 06/17/2017 0940   WBC 9.4 01/16/2016 1622   RBC 4.78 06/17/2017 0940   RBC 4.40 01/16/2016 1622   HGB 14.1 06/17/2017 0940   HCT 43.7 06/17/2017 0940   PLT 282 06/17/2017 0940   MCV 91 06/17/2017 0940   MCH 29.5 06/17/2017 0940   MCH 30.0 01/16/2016 1622   MCHC 32.3 06/17/2017 0940   MCHC 33.6 01/16/2016 1622   RDW 14.7 06/17/2017 0940   LYMPHSABS 2.0 06/17/2017 0940   MONOABS 0.9 01/16/2016 1622   EOSABS 0.1 06/17/2017 0940   BASOSABS 0.0 06/17/2017 0940   Iron/TIBC/Ferritin/ %Sat No results found for: IRON, TIBC, FERRITIN, IRONPCTSAT Lipid Panel     Component Value Date/Time   CHOL 181 06/17/2017 0940   TRIG 109 06/17/2017 0940   HDL 60 06/17/2017 0940   LDLCALC 99 06/17/2017 0940   Hepatic Function Panel     Component Value Date/Time   PROT 7.0 06/17/2017 0940   ALBUMIN 4.1 06/17/2017 0940   AST 21 06/17/2017 0940   ALT 21 06/17/2017 0940   ALKPHOS 83 06/17/2017 0940   BILITOT 0.7 06/17/2017 0940      Component Value Date/Time   TSH 1.230 06/17/2017 0940   TSH 1.15 05/15/2017   TSH 1.20 01/16/2016 1622    ASSESSMENT AND PLAN: Essential hypertension  Class 1 obesity with serious comorbidity and body mass index (BMI) of 34.0 to 34.9 in adult, unspecified obesity type  PLAN:  Hypertension We discussed sodium restriction, working on healthy weight loss, and a regular exercise program as the means to achieve improved blood pressure control. [name] agreed with this plan and agreed to follow up as directed. We will continue to monitor her blood pressure as well as her progress with the above lifestyle modifications. She will continue her medications as prescribed and will watch for signs of hypotension as she continues her lifestyle modifications. Andrea [name] agrees to follow up with our clinic in 3 weeks.  We spent > than 50% of the 15 minute visit on the counseling as documented in the note.  Obesity [name] is currently in the  action stage of change. As such, her goal is to continue with weight loss efforts She has agreed to change to keep a food journal with 1200 calories and 80 grams of protein daily Andrea [name] has been instructed to work up to a goal of 150 minutes of combined cardio and strengthening exercise per week for weight loss and overall health benefits. We discussed the following Behavioral Modification Strategies today: increasing lean protein intake and holiday eating strategies    Pennie has agreed to follow up with our clinic in 3 weeks. She was informed of the importance of frequent follow up visits to maximize her success with intensive lifestyle modifications for her multiple  health conditions.  I, Trixie Dredge, am acting as transcriptionist for Lacy Duverney, PA-C  I have reviewed the above documentation for accuracy and completeness, and I agree with the above. -Lacy Duverney, PA-C  I have reviewed the above note and agree with the plan. -Dennard Nip, MD     Today's visit was # 7 out of 22.  Starting weight: 211 lbs Starting date: 06/17/17 Today's weight : 196 lbs  Today's date: 09/10/2017 Total lbs lost to date: 15 (Patients must lose 7 lbs in the first 6 months to continue with counseling)   ASK: We discussed the diagnosis of obesity with Minta Balsam today and Ronnisha agreed to give [place] permission to discuss obesity behavioral modification therapy today.  ASSESS: Missie has the diagnosis of obesity and her BMI today is 34.73 Julieta is in the action stage of change   ADVISE: Karissa was educated on the multiple health risks of obesity as well as the benefit of weight loss to improve her health. She was advised of the need for long term treatment and the importance of lifestyle modifications.  AGREE: Multiple dietary modification options and treatment options were discussed and  Shaleka agreed to keep a food journal with 1200 calories and 80 grams of protein daily We discussed the following  Behavioral Modification Strategies today: increasing lean protein intake and holiday eating strategies

## 2017-09-15 DIAGNOSIS — H04123 Dry eye syndrome of bilateral lacrimal glands: Secondary | ICD-10-CM | POA: Diagnosis not present

## 2017-09-15 DIAGNOSIS — H2513 Age-related nuclear cataract, bilateral: Secondary | ICD-10-CM | POA: Diagnosis not present

## 2017-09-15 DIAGNOSIS — H00024 Hordeolum internum left upper eyelid: Secondary | ICD-10-CM | POA: Diagnosis not present

## 2017-09-15 DIAGNOSIS — H01001 Unspecified blepharitis right upper eyelid: Secondary | ICD-10-CM | POA: Diagnosis not present

## 2017-09-17 ENCOUNTER — Other Ambulatory Visit: Payer: Self-pay | Admitting: Physician Assistant

## 2017-09-17 DIAGNOSIS — Z1231 Encounter for screening mammogram for malignant neoplasm of breast: Secondary | ICD-10-CM

## 2017-09-24 MED FILL — VALSARTAN-HCTZ 80-12.5 MG T: 80-12.5 | 90 days supply | Qty: 90 | Fill #2

## 2017-10-02 ENCOUNTER — Ambulatory Visit (INDEPENDENT_AMBULATORY_CARE_PROVIDER_SITE_OTHER): Payer: 59 | Admitting: Physician Assistant

## 2017-10-02 VITALS — BP 127/76 | HR 72 | Temp 98.0°F | Ht 63.0 in | Wt 197.0 lb

## 2017-10-02 DIAGNOSIS — E8881 Metabolic syndrome: Secondary | ICD-10-CM

## 2017-10-02 DIAGNOSIS — E559 Vitamin D deficiency, unspecified: Secondary | ICD-10-CM | POA: Diagnosis not present

## 2017-10-02 DIAGNOSIS — Z9189 Other specified personal risk factors, not elsewhere classified: Secondary | ICD-10-CM

## 2017-10-02 DIAGNOSIS — E7849 Other hyperlipidemia: Secondary | ICD-10-CM | POA: Diagnosis not present

## 2017-10-02 DIAGNOSIS — I10 Essential (primary) hypertension: Secondary | ICD-10-CM | POA: Diagnosis not present

## 2017-10-02 DIAGNOSIS — Z6835 Body mass index (BMI) 35.0-35.9, adult: Secondary | ICD-10-CM

## 2017-10-02 DIAGNOSIS — R7303 Prediabetes: Secondary | ICD-10-CM | POA: Diagnosis not present

## 2017-10-02 MED ORDER — VITAMIN D (ERGOCALCIFEROL) 1.25 MG (50000 UNIT) PO CAPS: 50000.0000 [IU] | ORAL_CAPSULE | ORAL | 0 refills | Status: AC

## 2017-10-02 MED FILL — VIT D2 1.25 MG (50,000 UNIT: 1.25 MG | 28 days supply | Qty: 4 | Fill #0
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
## 2017-10-03 LAB — COMPREHENSIVE METABOLIC PANEL
A/G RATIO: 1.4 (ref 1.2–2.2)
ALT: 17 IU/L (ref 0–32)
AST: 16 IU/L (ref 0–40)
Albumin: 4.1 g/dL (ref 3.6–4.8)
Alkaline Phosphatase: 81 IU/L (ref 39–117)
BUN/Creatinine Ratio: 22 (ref 12–28)
BUN: 15 mg/dL (ref 8–27)
Bilirubin Total: 0.8 mg/dL (ref 0.0–1.2)
CALCIUM: 9.7 mg/dL (ref 8.7–10.3)
CO2: 26 mmol/L (ref 20–29)
Chloride: 107 mmol/L — ABNORMAL HIGH (ref 96–106)
Creatinine, Ser: 0.68 mg/dL (ref 0.57–1.00)
GFR, EST AFRICAN AMERICAN: 109 mL/min/{1.73_m2} (ref >59)
GFR, EST NON AFRICAN AMERICAN: 95 mL/min/{1.73_m2} (ref >59)
Globulin, Total: 2.9 g/dL (ref 1.5–4.5)
Glucose: 94 mg/dL (ref 65–99)
POTASSIUM: 4.4 mmol/L (ref 3.5–5.2)
Sodium: 146 mmol/L — ABNORMAL HIGH (ref 134–144)
TOTAL PROTEIN: 7 g/dL (ref 6.0–8.5)

## 2017-10-03 LAB — INSULIN, RANDOM: INSULIN: 10 u[IU]/mL (ref 2.6–24.9)

## 2017-10-03 LAB — LIPID PANEL WITH LDL/HDL RATIO
CHOLESTEROL TOTAL: 192 mg/dL (ref 100–199)
HDL: 62 mg/dL (ref >39)
LDL Calculated: 113 mg/dL — ABNORMAL HIGH (ref 0–99)
LDl/HDL Ratio: 1.8 ratio (ref 0.0–3.2)
Triglycerides: 87 mg/dL (ref 0–149)
VLDL CHOLESTEROL CAL: 17 mg/dL (ref 5–40)

## 2017-10-03 LAB — VITAMIN D 25 HYDROXY (VIT D DEFICIENCY, FRACTURES): VIT D 25 HYDROXY: 29 ng/mL — AB (ref 30.0–100.0)

## 2017-10-03 LAB — HEMOGLOBIN A1C
Est. average glucose Bld gHb Est-mCnc: 117 mg/dL
Hgb A1c MFr Bld: 5.7 % — ABNORMAL HIGH (ref 4.8–5.6)

## 2017-10-09 ENCOUNTER — Encounter (INDEPENDENT_AMBULATORY_CARE_PROVIDER_SITE_OTHER): Payer: Self-pay | Admitting: Physician Assistant

## 2017-10-16 DIAGNOSIS — L814 Other melanin hyperpigmentation: Secondary | ICD-10-CM | POA: Diagnosis not present

## 2017-10-16 DIAGNOSIS — B078 Other viral warts: Secondary | ICD-10-CM | POA: Diagnosis not present

## 2017-10-16 DIAGNOSIS — L821 Other seborrheic keratosis: Secondary | ICD-10-CM | POA: Diagnosis not present

## 2017-10-16 DIAGNOSIS — B079 Viral wart, unspecified: Secondary | ICD-10-CM | POA: Diagnosis not present

## 2017-10-16 DIAGNOSIS — D1801 Hemangioma of skin and subcutaneous tissue: Secondary | ICD-10-CM | POA: Diagnosis not present

## 2017-10-16 DIAGNOSIS — L570 Actinic keratosis: Secondary | ICD-10-CM | POA: Diagnosis not present

## 2017-10-23 ENCOUNTER — Ambulatory Visit (INDEPENDENT_AMBULATORY_CARE_PROVIDER_SITE_OTHER): Payer: 59 | Admitting: Family Medicine

## 2017-10-23 ENCOUNTER — Encounter (INDEPENDENT_AMBULATORY_CARE_PROVIDER_SITE_OTHER): Payer: Self-pay

## 2017-10-27 ENCOUNTER — Encounter (INDEPENDENT_AMBULATORY_CARE_PROVIDER_SITE_OTHER): Payer: Self-pay | Admitting: Family Medicine

## 2017-10-27 NOTE — Telephone Encounter (Signed)
Please advise 

## 2017-11-03 ENCOUNTER — Ambulatory Visit
Admission: RE | Admit: 2017-11-03 | Discharge: 2017-11-03 | Disposition: A | Discharge status: Home / Self Care | Payer: 59 | Source: Ambulatory Visit | Attending: Physician Assistant | Admitting: Physician Assistant

## 2017-11-03 DIAGNOSIS — Z1231 Encounter for screening mammogram for malignant neoplasm of breast: Secondary | ICD-10-CM | POA: Diagnosis not present

## 2017-11-06 ENCOUNTER — Ambulatory Visit (INDEPENDENT_AMBULATORY_CARE_PROVIDER_SITE_OTHER): Payer: 59 | Admitting: Family Medicine

## 2017-11-10 ENCOUNTER — Other Ambulatory Visit: Payer: Self-pay | Admitting: Physician Assistant

## 2017-11-10 DIAGNOSIS — M810 Age-related osteoporosis without current pathological fracture: Secondary | ICD-10-CM

## 2017-11-10 DIAGNOSIS — I10 Essential (primary) hypertension: Secondary | ICD-10-CM | POA: Diagnosis not present

## 2017-11-10 DIAGNOSIS — Z6836 Body mass index (BMI) 36.0-36.9, adult: Secondary | ICD-10-CM | POA: Diagnosis not present

## 2017-11-10 DIAGNOSIS — R0981 Nasal congestion: Secondary | ICD-10-CM | POA: Diagnosis not present

## 2017-11-10 DIAGNOSIS — Z0000 Encounter for general adult medical examination without abnormal findings: Secondary | ICD-10-CM | POA: Diagnosis not present

## 2017-11-10 MED FILL — FLUTICASONE PROP 50 MCG SPR: 50 | 60 days supply | Qty: 16 | Fill #0

## 2017-11-26 ENCOUNTER — Inpatient Hospital Stay
Admission: RE | Admit: 2017-11-26 | Discharge: 2017-11-26 | Disposition: A | Discharge status: Home / Self Care | Payer: 59 | Source: Ambulatory Visit | Attending: Physician Assistant | Admitting: Physician Assistant

## 2017-12-04 ENCOUNTER — Other Ambulatory Visit: Payer: Self-pay | Admitting: Physician Assistant

## 2017-12-04 DIAGNOSIS — E2839 Other primary ovarian failure: Secondary | ICD-10-CM

## 2017-12-05 MED FILL — VALSARTAN-HCTZ 80-12.5 MG T: 80-12.5 | 90 days supply | Qty: 90 | Fill #0

## 2017-12-17 ENCOUNTER — Ambulatory Visit
Admission: RE | Admit: 2017-12-17 | Discharge: 2017-12-17 | Disposition: A | Discharge status: Home / Self Care | Payer: 59 | Source: Ambulatory Visit | Attending: Physician Assistant | Admitting: Physician Assistant

## 2017-12-17 DIAGNOSIS — Z780 Asymptomatic menopausal state: Secondary | ICD-10-CM | POA: Diagnosis not present

## 2017-12-17 DIAGNOSIS — E2839 Other primary ovarian failure: Secondary | ICD-10-CM

## 2017-12-17 DIAGNOSIS — Z13820 Encounter for screening for osteoporosis: Secondary | ICD-10-CM | POA: Diagnosis not present

## 2017-12-22 ENCOUNTER — Telehealth: Payer: 59 | Admitting: Family

## 2017-12-22 DIAGNOSIS — M545 Low back pain: Secondary | ICD-10-CM | POA: Diagnosis not present

## 2017-12-22 MED ORDER — ETODOLAC 300 MG PO CAPS: 300.0000 mg | ORAL_CAPSULE | Freq: Two times a day (BID) | ORAL | 0 refills | Status: AC

## 2017-12-22 MED ORDER — BACLOFEN 10 MG PO TABS: 10.0000 mg | ORAL_TABLET | Freq: Three times a day (TID) | ORAL | 0 refills | Status: AC | PRN

## 2017-12-22 MED FILL — ETODOLAC 300 MG CAPSULE: 300 | 10 days supply | Qty: 20 | Fill #0

## 2017-12-22 MED FILL — BACLOFEN 10 MG TABLET: 10 | 10 days supply | Qty: 30 | Fill #0

## 2017-12-22 NOTE — Progress Notes (Signed)

## 2018-02-12 DIAGNOSIS — M25561 Pain in right knee: Secondary | ICD-10-CM | POA: Diagnosis not present

## 2018-02-12 DIAGNOSIS — M25562 Pain in left knee: Secondary | ICD-10-CM | POA: Diagnosis not present

## 2018-03-23 MED FILL — VALSARTAN-HCTZ 80-12.5 MG T: 80-12.5 | 90 days supply | Qty: 90 | Fill #1

## 2018-03-27 DIAGNOSIS — J310 Chronic rhinitis: Secondary | ICD-10-CM | POA: Diagnosis not present

## 2018-03-27 DIAGNOSIS — H6993 Unspecified Eustachian tube disorder, bilateral: Secondary | ICD-10-CM | POA: Diagnosis not present

## 2018-03-27 MED FILL — NASACORT ALLERGY 24HR SPRAY: 55 MCG | 15 days supply | Qty: 11 | Fill #0

## 2018-06-04 DIAGNOSIS — I10 Essential (primary) hypertension: Secondary | ICD-10-CM | POA: Diagnosis not present

## 2018-06-04 MED FILL — VALSARTAN-HCTZ 80-12.5 MG T: 80-12.5 | 90 days supply | Qty: 90 | Fill #0

## 2018-07-21 DIAGNOSIS — H5203 Hypermetropia, bilateral: Secondary | ICD-10-CM | POA: Diagnosis not present

## 2018-07-21 DIAGNOSIS — H524 Presbyopia: Secondary | ICD-10-CM | POA: Diagnosis not present

## 2018-07-21 DIAGNOSIS — H52223 Regular astigmatism, bilateral: Secondary | ICD-10-CM | POA: Diagnosis not present

## 2018-09-01 DIAGNOSIS — M25562 Pain in left knee: Secondary | ICD-10-CM | POA: Diagnosis not present

## 2018-09-01 DIAGNOSIS — M25561 Pain in right knee: Secondary | ICD-10-CM | POA: Diagnosis not present

## 2018-09-15 DIAGNOSIS — N950 Postmenopausal bleeding: Secondary | ICD-10-CM | POA: Diagnosis not present

## 2018-09-15 MED FILL — VALSARTAN-HCTZ 80-12.5 MG T: 80-12.5 | 90 days supply | Qty: 90 | Fill #1

## 2018-09-30 ENCOUNTER — Other Ambulatory Visit: Payer: Self-pay | Admitting: Physician Assistant

## 2018-09-30 DIAGNOSIS — Z1231 Encounter for screening mammogram for malignant neoplasm of breast: Secondary | ICD-10-CM

## 2018-10-07 DIAGNOSIS — N950 Postmenopausal bleeding: Secondary | ICD-10-CM | POA: Diagnosis not present

## 2018-10-08 ENCOUNTER — Telehealth: Payer: 59 | Admitting: Family Medicine

## 2018-10-08 DIAGNOSIS — B9789 Other viral agents as the cause of diseases classified elsewhere: Secondary | ICD-10-CM | POA: Diagnosis not present

## 2018-10-08 DIAGNOSIS — J019 Acute sinusitis, unspecified: Secondary | ICD-10-CM | POA: Diagnosis not present

## 2018-10-08 MED ORDER — AZELASTINE HCL 0.1 % NA SOLN: 1.0000 | Freq: Two times a day (BID) | NASAL | 0 refills | Status: AC

## 2018-10-08 MED FILL — AZELASTINE HCL 137 MCG/SPRA: 137 | 50 days supply | Qty: 30 | Fill #0

## 2018-10-08 NOTE — Progress Notes (Signed)
We are sorry that you are not feeling well.  Here is how we plan to help!  Based on what you have shared with me it looks like you have sinusitis.  Sinusitis is inflammation and infection in the sinus cavities of the head.  Based on your presentation I believe you most likely have Acute Viral Sinusitis.This is an infection most likely caused by a virus. There is not specific treatment for viral sinusitis other than to help you with the symptoms until the infection runs its course.  You may use an oral decongestant such as Mucinex D or if you have glaucoma or high blood pressure use plain Mucinex. Saline nasal spray help and can safely be used as often as needed for congestion, I have prescribed: Azelastine nasal spray 2 sprays in each nostril twice a day  Some authorities believe that zinc sprays or the use of Echinacea may shorten the course of your symptoms.  Sinus infections are not as easily transmitted as other respiratory infection, however we still recommend that you avoid close contact with loved ones, especially the very young and elderly.  Remember to wash your hands thoroughly throughout the day as this is the number one way to prevent the spread of infection!  @ Most sinus infections lasting less than 7-10 days are viral in nature especially when not accompanied by a fever. If you feel you need more treatment than what was provided today please seek face to face evaluation for a detailed history and physical.@ Home Care:  Only take medications as instructed by your medical team.  Do not take these medications with alcohol.  A steam or ultrasonic humidifier can help congestion.  You can place a towel over your head and breathe in the steam from hot water coming from a faucet.  Avoid close contacts especially the very young and the elderly.  Cover your mouth when you cough or sneeze.  Always remember to wash your hands.  Get Help Right Away If:  You develop worsening fever or sinus  pain.  You develop a severe head ache or visual changes.  Your symptoms persist after you have completed your treatment plan.  Make sure you  Understand these instructions.  Will watch your condition.  Will get help right away if you are not doing well or get worse.  Your e-visit answers were reviewed by a board certified advanced clinical practitioner to complete your personal care plan.  Depending on the condition, your plan could have included both over the counter or prescription medications.  If there is a problem please reply  once you have received a response from your provider.  Your safety is important to [place].  If you have drug allergies check your prescription carefully.    You can use MyChart to ask questions about today's visit, request a non-urgent call back, or ask for a work or school excuse for 24 hours related to this e-Visit. If it has been greater than 24 hours you will need to follow up with your provider, or enter a new e-Visit to address those concerns.  You will get an e-mail in the next two days asking about your experience.  I hope that your e-visit has been valuable and will speed your recovery. Thank you for using e-visits.

## 2018-11-06 ENCOUNTER — Ambulatory Visit
Admission: RE | Admit: 2018-11-06 | Discharge: 2018-11-06 | Disposition: A | Discharge status: Home / Self Care | Payer: 59 | Source: Ambulatory Visit | Attending: Physician Assistant | Admitting: Physician Assistant

## 2018-11-06 DIAGNOSIS — Z1231 Encounter for screening mammogram for malignant neoplasm of breast: Secondary | ICD-10-CM

## 2018-11-12 DIAGNOSIS — Z0000 Encounter for general adult medical examination without abnormal findings: Secondary | ICD-10-CM | POA: Diagnosis not present

## 2018-11-12 DIAGNOSIS — E7800 Pure hypercholesterolemia, unspecified: Secondary | ICD-10-CM | POA: Diagnosis not present

## 2018-11-12 DIAGNOSIS — I10 Essential (primary) hypertension: Secondary | ICD-10-CM | POA: Diagnosis not present

## 2018-12-17 MED FILL — VALSARTAN-HCTZ 80-12.5 MG T: 80-12.5 | 90 days supply | Qty: 90 | Fill #2

## 2019-02-19 MED FILL — VALSARTAN-HCTZ 80-12.5 MG T: 80-12.5 | 30 days supply | Qty: 30 | Fill #0

## 2019-03-16 MED FILL — VALSARTAN-HCTZ 80-12.5 MG T: 80-12.5 | 30 days supply | Qty: 30 | Fill #1

## 2019-04-05 ENCOUNTER — Encounter: Payer: Self-pay | Admitting: Physician Assistant

## 2019-04-05 ENCOUNTER — Telehealth: Payer: 59 | Admitting: Physician Assistant

## 2019-04-05 DIAGNOSIS — M5440 Lumbago with sciatica, unspecified side: Secondary | ICD-10-CM | POA: Diagnosis not present

## 2019-04-05 MED ORDER — CYCLOBENZAPRINE HCL 10 MG PO TABS: 10.0000 mg | ORAL_TABLET | Freq: Three times a day (TID) | ORAL | 0 refills | Status: AC | PRN

## 2019-04-05 MED ORDER — NAPROXEN 500 MG PO TABS: 500.0000 mg | ORAL_TABLET | Freq: Two times a day (BID) | ORAL | 0 refills | Status: AC

## 2019-04-05 MED FILL — NAPROXEN 500 MG TABLET: 500 | 10 days supply | Qty: 20 | Fill #0

## 2019-04-05 MED FILL — CYCLOBENZAPRINE 10 MG TAB: 10 | 4 days supply | Qty: 12 | Fill #0

## 2019-04-05 NOTE — Progress Notes (Signed)

## 2019-04-14 MED FILL — VALSARTAN-HCTZ 80-12.5 MG T: 80-12.5 | 30 days supply | Qty: 30 | Fill #2

## 2019-05-14 DIAGNOSIS — I10 Essential (primary) hypertension: Secondary | ICD-10-CM | POA: Diagnosis not present

## 2019-05-14 DIAGNOSIS — E669 Obesity, unspecified: Secondary | ICD-10-CM | POA: Diagnosis not present

## 2019-05-14 DIAGNOSIS — Z6836 Body mass index (BMI) 36.0-36.9, adult: Secondary | ICD-10-CM | POA: Diagnosis not present

## 2019-05-14 MED FILL — VALSARTAN-HCTZ 80-12.5 MG T: 80-12.5 | 30 days supply | Qty: 30 | Fill #0

## 2019-05-18 DIAGNOSIS — I10 Essential (primary) hypertension: Secondary | ICD-10-CM | POA: Diagnosis not present

## 2019-06-10 DIAGNOSIS — M5440 Lumbago with sciatica, unspecified side: Secondary | ICD-10-CM | POA: Diagnosis not present

## 2019-06-10 MED FILL — tiZANidine HCL 4 MG TABS: 4 | 15 days supply | Qty: 30 | Fill #0

## 2019-06-10 MED FILL — METHYLPREDNISOLONE 4 MG TAB: 4 | 6 days supply | Qty: 21 | Fill #0

## 2019-06-16 ENCOUNTER — Other Ambulatory Visit: Payer: Self-pay

## 2019-06-16 ENCOUNTER — Ambulatory Visit: Payer: 59 | Attending: Neurosurgery | Admitting: Physical Therapy

## 2019-06-16 DIAGNOSIS — M5416 Radiculopathy, lumbar region: Secondary | ICD-10-CM | POA: Diagnosis not present

## 2019-06-16 DIAGNOSIS — M6281 Muscle weakness (generalized): Secondary | ICD-10-CM | POA: Diagnosis not present

## 2019-06-16 DIAGNOSIS — M62830 Muscle spasm of back: Secondary | ICD-10-CM | POA: Insufficient documentation

## 2019-06-16 NOTE — Therapy (Signed)
[facility] [address]  [place], [place], [address] Phone: [phone]   Fax:  [phone]  Physical Therapy Evaluation  Patient Details  Name: Andrea [name] MRN: [id number] Date of Birth: May 10, 1956 Referring Provider (PT): Kary Kos, MD   Encounter Date: 06/16/2019  PT End of Session - 06/16/19 0849    Visit Number  1    Number of Visits  12    Date for PT Re-Evaluation  07/28/19    Authorization Type  [facility]    PT Start Time  0849    PT Stop Time  0938    PT Time Calculation (min)  49 min    Activity Tolerance  Patient tolerated treatment well    Behavior During Therapy  [facility] for tasks assessed/performed       Past Medical History:  Diagnosis Date  . Gallbladder problem   . GERD (gastroesophageal reflux disease)   . Hyperlipidemia   . Hypertension   . Joint pain   . Leg edema   . Multiple food allergies   . Obesity     Past Surgical History:  Procedure Laterality Date  . APPENDECTOMY    . CESAREAN SECTION    . HERNIA REPAIR    . LAPAROSCOPIC CHOLECYSTECTOMY    . POSTERIOR TIBIAL TENDON REPAIR    . TONSILECTOMY, ADENOIDECTOMY, BILATERAL MYRINGOTOMY AND TUBES  1983    There were no vitals filed for this visit.   Subjective Assessment - 06/16/19 0852    Subjective  Patient works as a Therapist, sports at [employer] but has been working from home since [place] hit in March. She has been working on the computer, sitting most of the time but does not think the chair she is using provides goo support. Has standing desk but doesn't use regularly use it as all of computer equipment doesn't fit. She reports long h/o low back issues worsening over last month or two. Pain limits sitting tolerance and makes going to sleep difficult, sometimes taking a couple hours to get to sleep, but then pain does not typically wake her up. Was treated with steroid dose pack with some relief but not full resolution of pain.    Limitations   Sitting    How long can you sit comfortably?  "just a few minutes"    Patient Stated Goals  "pain to go away so I can sleep"    Currently in Pain?  Yes    Pain Score  0-No pain    Pain Location  Back   predominantly radicular symptoms with minimal to no LBP   Pain Orientation  Lower    Pain Type  Acute pain;Chronic pain    Pain Radiating Towards  R LE- up to 6/10 "electrical sensation with occasional cramping" from buttock down posterior lateral thigh and calf to foot; L LE - up to 3/10 same sensation as R but to lesser extent only extending from buttock to posterior thigh, but also numbness in anterior thigh    Pain Onset  More than a month ago    Pain Frequency  Intermittent    Aggravating Factors   sitting    Pain Relieving Factors  walking; shiftring weight    Effect of Pain on Daily Activities  interferes with sleep; limits sitting tolerance         [facility] PT Assessment - 06/16/19 0849      Assessment   Medical Diagnosis  B lumbar radiculopathy  Referring Provider (PT)  Kary Kos, MD    Onset Date/Surgical Date  --   ~1 month   Next MD Visit  06/24/19    Prior Therapy  none for current problem; PT ~10 yrs ago s/p repair of torn postierior tibialis tendon      Balance Screen   Has the patient fallen in the past 6 months  No    Has the patient had a decrease in activity level because of a fear of falling?   No    Is the patient reluctant to leave their home because of a fear of falling?   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    [place]; sew, ride motorcycle; no regular exercise      Cognition    Overall Cognitive Status  Within Functional Limits for tasks assessed      Observation/Other Assessments   Focus on Therapeutic Outcomes (FOTO)   Lumbar - 65% (35% limitation); Predicted 74% (26% limitation)      ROM / Strength   AROM / PROM / Strength  AROM;Strength      AROM   Overall AROM   Within functional limits for tasks performed    Overall AROM Comments  mildly lmited B lateral flexion but no pain with any lumbar motion    AROM Assessment Site  Lumbar      Strength   Strength Assessment Site  Hip;Knee    Right/Left Hip  Right;Left    Right Hip Flexion  4/5    Right Hip Extension  4-/5    Right Hip External Rotation   4-/5    Right Hip Internal Rotation  4-/5    Right Hip ABduction  4-/5    Right Hip ADduction  4/5    Left Hip Flexion  4/5    Left Hip Extension  4-/5    Left Hip External Rotation  4-/5    Left Hip Internal Rotation  4-/5    Left Hip ABduction  4-/5    Left Hip ADduction  4/5    Right/Left Knee  Right;Left    Right Knee Flexion  4/5    Right Knee Extension  4+/5    Left Knee Flexion  4/5    Left Knee Extension  4+/5      Flexibility   Soft Tissue Assessment /Muscle Length  yes    Hamstrings  mild tight B    Quadriceps  WFL    ITB  mild/mod tight B    Piriformis  mild/mod tight B      Palpation   Spinal mobility  mild hypomobility lower lumbar spine    Palpation comment  increased muscle tension in lumbar paraspinals and glutes/piriformis R>L but denies ttp                Objective measurements completed on examination: See above findings.      [facility] Adult PT Treatment/Exercise - 06/16/19 0849      Exercises   Exercises  Lumbar      Lumbar Exercises: Stretches   Passive Hamstring Stretch  Right;30 seconds;1 rep    Passive Hamstring Stretch Limitations  supine with strap    ITB Stretch  Right;30 seconds;1 rep    ITB Stretch Limitations  supine with strap    Piriformis Stretch  Right;30 seconds;1 rep    Piriformis Stretch  Limitations  supine KTOS    Figure 4 Stretch  30 seconds;1 rep;Supine;With overpressure    Figure 4 Stretch Limitations  figure 4 to chest      Lumbar Exercises: Supine   Pelvic Tilt  10 reps;5 seconds             PT Education - 06/16/19 0938    Education Details  PT eval findings, anticipated POC & initial HEP    Person(s) Educated  Patient    Methods  Explanation;Demonstration;Handout    Comprehension  Verbalized understanding;Returned demonstration;Need further instruction       PT Short Term Goals - 06/16/19 0938      PT SHORT TERM GOAL #1   Title  Patient will be independent with initial HEP    Status  New    Target Date  07/07/19      PT SHORT TERM GOAL #2   Title  Patient will verbalize/demonstrate understanding of neutral spine posture and proper body mechanics to reduce strain on lumbar spine    Status  New    Target Date  07/07/19        PT Long Term Goals - 06/16/19 0938      PT LONG TERM GOAL #1   Title  Patient will be independent with ongoing/advanced HEP    Status  New    Target Date  07/28/19      PT LONG TERM GOAL #2   Title  Patient to demonstrate appropriate posture and body mechanics needed for daily activities    Status  New    Target Date  07/28/19      PT LONG TERM GOAL #3   Title  B proximal LE strength >/= 4+/5 for improved stability    Status  New    Target Date  07/28/19      PT LONG TERM GOAL #4   Title  Patient will report ability to sit for >/= 30-45 minutes w/o limitation due to lumbar radiculopathy    Status  New    Target Date  07/28/19      PT LONG TERM GOAL #5   Title  Patient to report ability to perform work and daily activities without pain provocation    Status  New    Target Date  07/28/19      PT LONG TERM GOAL #6   Title  Patient will report ability to fall asleep in a timely manner w/o limitation due to lumbar radiculopathy    Status  New    Target Date  07/28/19             Plan - 06/16/19 [id number]     Clinical Impression Statement  Andrea [name] is a 63 y/o female who presents to OP PT for acute B lumbar radiculopathy in mostly L5 distribution, R>L, with onset in past 1-2 months. She reports remote h/o LBP ~6 yrs ago which did not require PT intervention at the time. She feels recent exacerbation may be related to working from home since COVID-19 restrictions instituted in March as she feels her seat at home does not provide good support and  she is unable to consistently use her standing desk. LE radicular pain currently limits sitting tolerance and interferes with ability to fall asleep at night. Lumbar ROM essentially WFL with exception of mildly limited lateral flexion bilaterally. Proximal LE flexibility mildly limited in most muscle groups. Overall LE strength grossly 4-5 to 4/5 with decreased core activation noted. Andrea [name] will benefit from skilled PT to address pain and above deficits to allow for improved quality of life and resumption of prior activities with reduced pain.    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; h/o posterior tibial tendon repair for atraumatic rupture; HTN; obesity    Examination-Activity Limitations  Sit;Sleep    Examination-Participation Restrictions  --   working from home   Stability/Clinical Decision Making  Stable/Uncomplicated    Clinical Decision Making  Low    Rehab Potential  Good    PT Frequency  2x / week    PT Duration  6 weeks    PT Treatment/Interventions  ADLs/Self Care Home Management;Cryotherapy;Electrical Stimulation;Iontophoresis 4mg /ml Dexamethasone;Moist Heat;Traction;Ultrasound;Gait training;Functional mobility training;Therapeutic activities;Therapeutic exercise;Balance training;Neuromuscular re-education;Patient/family education;Manual techniques;Passive range of motion;Dry needling;Taping;Spinal Manipulations    PT Next Visit  Plan  Review initial HEP and progress lumbopelvic stretching and strengthening; posture & body mechanics education    Consulted and Agree with Plan of Care  Patient       Patient will benefit from skilled therapeutic intervention in order to improve the following deficits and impairments:  Decreased activity tolerance, Decreased mobility, Decreased range of motion, Decreased strength, Hypomobility, Increased muscle spasms, Impaired flexibility, Impaired sensation, Postural dysfunction, Improper body mechanics, Pain  Visit Diagnosis: Radiculopathy, lumbar region  Muscle weakness (generalized)  Muscle spasm of back     Problem List Patient Active Problem List   Diagnosis Date Noted  . Vitamin D deficiency 07/01/2017  . Insulin resistance 07/01/2017  . Class 2 obesity with serious comorbidity and body mass index (BMI) of 36.0 to 36.9 in adult 07/01/2017  . Other fatigue 06/17/2017  . Shortness of breath on exertion 06/17/2017  . Essential hypertension 06/17/2017  . Class 2 obesity with serious comorbidity and body mass index (BMI) of 37.0 to 37.9 in adult 06/17/2017  . Angioedema 01/16/2016  . Allergic rhinitis 01/16/2016  . Chronic urticaria 12/19/2015    Percival Spanish, PT, MPT 06/16/2019, 4:10 PM  [facility] [address]  [place], [place], [address] Phone: [phone]   Fax:  [phone]  Name: Andrea [name] MRN: [id number] Date of Birth: [birth year]

## 2019-06-23 ENCOUNTER — Ambulatory Visit: Payer: 59 | Attending: Neurosurgery | Admitting: Physical Therapy

## 2019-06-23 ENCOUNTER — Encounter: Payer: Self-pay | Admitting: Physical Therapy

## 2019-06-23 ENCOUNTER — Other Ambulatory Visit: Payer: Self-pay

## 2019-06-23 DIAGNOSIS — M6281 Muscle weakness (generalized): Secondary | ICD-10-CM | POA: Diagnosis not present

## 2019-06-23 DIAGNOSIS — M62830 Muscle spasm of back: Secondary | ICD-10-CM | POA: Diagnosis not present

## 2019-06-23 DIAGNOSIS — M5416 Radiculopathy, lumbar region: Secondary | ICD-10-CM | POA: Insufficient documentation

## 2019-06-23 NOTE — Therapy (Signed)
[facility] [address]  [place], [place], [address] Phone: [phone]   Fax:  [phone]  Physical Therapy Treatment  Patient Details  Name: Andrea [name] MRN: [id number] Date of Birth: November 13, 1955 Referring Provider (PT): Kary Kos, MD   Encounter Date: 06/23/2019  PT End of Session - 06/23/19 1020    Visit Number  2    Number of Visits  12    Date for PT Re-Evaluation  07/28/19    Authorization Type  [facility]    PT Start Time  1020    PT Stop Time  1118    PT Time Calculation (min)  58 min    Activity Tolerance  Patient tolerated treatment well    Behavior During Therapy  [facility] for tasks assessed/performed       Past Medical History:  Diagnosis Date  . Gallbladder problem   . GERD (gastroesophageal reflux disease)   . Hyperlipidemia   . Hypertension   . Joint pain   . Leg edema   . Multiple food allergies   . Obesity     Past Surgical History:  Procedure Laterality Date  . APPENDECTOMY    . CESAREAN SECTION    . HERNIA REPAIR    . LAPAROSCOPIC CHOLECYSTECTOMY    . POSTERIOR TIBIAL TENDON REPAIR    . TONSILECTOMY, ADENOIDECTOMY, BILATERAL MYRINGOTOMY AND TUBES  1983    There were no vitals filed for this visit.  Subjective Assessment - 06/23/19 1029    Subjective  No issues reported with initial HEP and denies need for review. She reports increased pain and difficulty finding comfortable position during car ride [place] from the beach after her son's wedding this weekend.    Patient Stated Goals  "pain to go away so I can sleep"    Currently in Pain?  No/denies                       [facility] Adult PT Treatment/Exercise - 06/23/19 1020      Self-Care   Self-Care  Posture    Posture  Provided instruciton in posture and body mechanics education for typical daily tasks, household chores and home work station set-up.      Exercises   Exercises  Lumbar      Lumbar Exercises: Aerobic   Nustep  L4 x 6 min      Lumbar Exercises: Supine   Pelvic Tilt  10 reps;5 seconds    Clam  10 reps;3 seconds    Clam Limitations  hooklying alt hip ABD/ER with red TB     Bridge  10 reps;3 seconds    Bridge Limitations  + hip abduction isometric with red TB      Manual Therapy   Manual Therapy  Soft tissue mobilization;Myofascial release;Other (comment)    Manual therapy comments  L sidelying with R LE supported on bolster    Soft tissue mobilization  STM/DTM to R glutes and piriformis    Myofascial Release  manual TPR to glute minimus & piriformis    Other Manual Therapy  Provided instruction in self-STM with ball on wall or in chair for glutes and piriformis.             PT Education - 06/23/19 1118    Education Details  HEP progression - lumbopelvic strengthening; Posture & body mechanics education    Person(s) Educated  Patient    Methods  Explanation;Demonstration;Verbal cues;Handout  Comprehension  Verbalized understanding;Returned demonstration;Verbal cues required;Need further instruction       PT Short Term Goals - 06/23/19 1050      PT SHORT TERM GOAL #1   Title  Patient will be independent with initial HEP    Status  On-going    Target Date  07/07/19      PT SHORT TERM GOAL #2   Title  Patient will verbalize/demonstrate understanding of neutral spine posture and proper body mechanics to reduce strain on lumbar spine    Status  On-going    Target Date  07/07/19        PT Long Term Goals - 06/23/19 1050      PT LONG TERM GOAL #1   Title  Patient will be independent with ongoing/advanced HEP    Status  On-going    Target Date  07/28/19      PT LONG TERM GOAL #2   Title  Patient to demonstrate appropriate posture and body mechanics needed for daily activities    Status  On-going    Target Date  07/28/19      PT LONG TERM GOAL #3   Title  B proximal LE strength >/= 4+/5 for improved stability    Status  On-going    Target Date  07/28/19      PT  LONG TERM GOAL #4   Title  Patient will report ability to sit for >/= 30-45 minutes w/o limitation due to lumbar radiculopathy    Status  On-going    Target Date  07/28/19      PT LONG TERM GOAL #5   Title  Patient to report ability to perform work and daily activities without pain provocation    Status  On-going    Target Date  07/28/19      PT LONG TERM GOAL #6   Title  Patient will report ability to fall asleep in a timely manner w/o limitation due to lumbar radiculopathy    Status  On-going    Target Date  07/28/19            Plan - 06/23/19 1051    Clinical Impression Statement  Andrea [name] reporting increased pain during car ride home from beach with difficulty finding a comfortable sitting position but denies pain today. She states it feels like she had a tight band running through her R buttock and down her posterior thigh - increased muscle tension and taut bands identified in R glutes and piriformis with patient noting good relief with this therefore provided instruction in home self-STM/release using small ball on wall. She reports no issues with initial HEP and denies need for review, therefore progressed lumbopelvic strengthening today and updated HEP accordingly. Treatment session concluded with education in proper posture and good body mechanics for typical daily tasks, household chores and home work-station set-up with patient reporting some awareness of posture and body mechanics from formally working as an Tree surgeon however also acknowledging areas for potential improvement.    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; h/o posterior tibial tendon repair for atraumatic rupture; HTN; obesity    Rehab Potential  Good    PT Frequency  2x / week    PT Duration  6 weeks    PT Treatment/Interventions  ADLs/Self Care Home Management;Cryotherapy;Electrical Stimulation;Iontophoresis 4mg /ml Dexamethasone;Moist Heat;Traction;Ultrasound;Gait training;Functional mobility  training;Therapeutic activities;Therapeutic exercise;Balance training;Neuromuscular re-education;Patient/family education;Manual techniques;Passive range of motion;Dry needling;Taping;Spinal Manipulations    PT Next Visit Plan  progress lumbopelvic stretching and strengthening;  review HEPs and posture & body mechanics education PRN; manual therapy & modalities PRN    Consulted and Agree with Plan of Care  Patient       Patient will benefit from skilled therapeutic intervention in order to improve the following deficits and impairments:  Decreased activity tolerance, Decreased mobility, Decreased range of motion, Decreased strength, Hypomobility, Increased muscle spasms, Impaired flexibility, Impaired sensation, Postural dysfunction, Improper body mechanics, Pain  Visit Diagnosis: Radiculopathy, lumbar region  Muscle weakness (generalized)  Muscle spasm of back     Problem List Patient Active Problem List   Diagnosis Date Noted  . Vitamin D deficiency 07/01/2017  . Insulin resistance 07/01/2017  . Class 2 obesity with serious comorbidity and body mass index (BMI) of 36.0 to 36.9 in adult 07/01/2017  . Other fatigue 06/17/2017  . Shortness of breath on exertion 06/17/2017  . Essential hypertension 06/17/2017  . Class 2 obesity with serious comorbidity and body mass index (BMI) of 37.0 to 37.9 in adult 06/17/2017  . Angioedema 01/16/2016  . Allergic rhinitis 01/16/2016  . Chronic urticaria 12/19/2015    Percival Spanish, PT, MPT 06/23/2019, 1:42 PM  [facility] [address]  [place], [place], [address] Phone: [phone]   Fax:  [phone]  Name: Andrea [name] MRN: [id number] Date of Birth: [birth year]

## 2019-06-23 NOTE — Patient Instructions (Addendum)

## 2019-06-24 DIAGNOSIS — R030 Elevated blood-pressure reading, without diagnosis of hypertension: Secondary | ICD-10-CM | POA: Diagnosis not present

## 2019-06-24 DIAGNOSIS — Z6838 Body mass index (BMI) 38.0-38.9, adult: Secondary | ICD-10-CM | POA: Diagnosis not present

## 2019-06-24 DIAGNOSIS — M5440 Lumbago with sciatica, unspecified side: Secondary | ICD-10-CM | POA: Diagnosis not present

## 2019-06-30 ENCOUNTER — Other Ambulatory Visit: Payer: Self-pay

## 2019-06-30 ENCOUNTER — Ambulatory Visit: Payer: 59

## 2019-06-30 DIAGNOSIS — M6281 Muscle weakness (generalized): Secondary | ICD-10-CM

## 2019-06-30 DIAGNOSIS — M62830 Muscle spasm of back: Secondary | ICD-10-CM

## 2019-06-30 DIAGNOSIS — M5416 Radiculopathy, lumbar region: Secondary | ICD-10-CM | POA: Diagnosis not present

## 2019-06-30 NOTE — Therapy (Signed)
[facility] [address]  [place], [place], [address] Phone: [phone]   Fax:  [phone]  Physical Therapy Treatment  Patient Details  Name: Andrea [name] MRN: [id number] Date of Birth: 30-Oct-1955 Referring Provider (PT): Kary Kos, MD   Encounter Date: 06/30/2019  PT End of Session - 06/30/19 1027    Visit Number  3    Number of Visits  12    Date for PT Re-Evaluation  07/28/19    Authorization Type  [facility]    PT Start Time  1019    PT Stop Time  1100    PT Time Calculation (min)  41 min    Activity Tolerance  Patient tolerated treatment well    Behavior During Therapy  [facility] for tasks assessed/performed       Past Medical History:  Diagnosis Date  . Gallbladder problem   . GERD (gastroesophageal reflux disease)   . Hyperlipidemia   . Hypertension   . Joint pain   . Leg edema   . Multiple food allergies   . Obesity     Past Surgical History:  Procedure Laterality Date  . APPENDECTOMY    . CESAREAN SECTION    . HERNIA REPAIR    . LAPAROSCOPIC CHOLECYSTECTOMY    . POSTERIOR TIBIAL TENDON REPAIR    . TONSILECTOMY, ADENOIDECTOMY, BILATERAL MYRINGOTOMY AND TUBES  1983    There were no vitals filed for this visit.  Subjective Assessment - 06/30/19 1024    Subjective  Pt. doing well today.    Patient Stated Goals  "pain to go away so I can sleep"    Currently in Pain?  No/denies    Pain Score  0-No pain   Pain down R leg 5/10 at worst   Pain Location  Back    Pain Orientation  Lower    Pain Type  Acute pain;Chronic pain    Multiple Pain Sites  No                       [facility] Adult PT Treatment/Exercise - 06/30/19 0001      Self-Care   Self-Care  Other Self-Care Comments    Other Self-Care Comments   Instruction on proper posture and body mechanics reviewing kitchen work and cleaning shower to suggest proper posture and body mechanics to reduce lumbar spine; reviewed ball massage  to glutes on wall to check for proper technique; pt. able to demo good understanding       Lumbar Exercises: Stretches   Piriformis Stretch  Right;30 seconds;1 rep    Piriformis Stretch Limitations  supine KTOS    Figure 4 Stretch  30 seconds;1 rep;Supine;With overpressure    Figure 4 Stretch Limitations  figure 4 to chest      Lumbar Exercises: Aerobic   Nustep  L4 x 6 min      Lumbar Exercises: Seated   Sit to Stand  10 reps    Sit to Stand Limitations  + B hip abd/ER into red TB at knees       Lumbar Exercises: Supine   Clam  10 reps;3 seconds    Clam Limitations  hooklying alt hip ABD/ER with red TB     Bridge  3 seconds   x 12 reps    Bridge Limitations  + hip abduction isometric with red TB      Lumbar Exercises: Sidelying   Clam  Right;Left;10 reps  Clam Limitations  red TB                PT Short Term Goals - 06/23/19 1050      PT SHORT TERM GOAL #1   Title  Patient will be independent with initial HEP    Status  On-going    Target Date  07/07/19      PT SHORT TERM GOAL #2   Title  Patient will verbalize/demonstrate understanding of neutral spine posture and proper body mechanics to reduce strain on lumbar spine    Status  On-going    Target Date  07/07/19        PT Long Term Goals - 06/23/19 1050      PT LONG TERM GOAL #1   Title  Patient will be independent with ongoing/advanced HEP    Status  On-going    Target Date  07/28/19      PT LONG TERM GOAL #2   Title  Patient to demonstrate appropriate posture and body mechanics needed for daily activities    Status  On-going    Target Date  07/28/19      PT LONG TERM GOAL #3   Title  B proximal LE strength >/= 4+/5 for improved stability    Status  On-going    Target Date  07/28/19      PT LONG TERM GOAL #4   Title  Patient will report ability to sit for >/= 30-45 minutes w/o limitation due to lumbar radiculopathy    Status  On-going    Target Date  07/28/19      PT LONG TERM GOAL #5    Title  Patient to report ability to perform work and daily activities without pain provocation    Status  On-going    Target Date  07/28/19      PT LONG TERM GOAL #6   Title  Patient will report ability to fall asleep in a timely manner w/o limitation due to lumbar radiculopathy    Status  On-going    Target Date  07/28/19            Plan - 06/30/19 1034    Clinical Impression Statement  [name] reporting she had some increased LBP after cleaning tub yesterday thus reviewed proper body mechanics and strategies for reducing lumbar strain with this activity.  Reviewed HEP with pt. demonstrating good overall understanding however requiring min cueing to avoid trunk rotation with red TB resisted clam shell.  Pt. noting improved buttocks "tenderness" after performing self-ball release to glutes on wall.  Ended visit with pt. declining modalities.  Pt. verbalizing improving awareness of proper posture and body mechanics with daily activities and progressing toward STGs.    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; h/o posterior tibial tendon repair for atraumatic rupture; HTN; obesity    Rehab Potential  Good    PT Treatment/Interventions  ADLs/Self Care Home Management;Cryotherapy;Electrical Stimulation;Iontophoresis 4mg /ml Dexamethasone;Moist Heat;Traction;Ultrasound;Gait training;Functional mobility training;Therapeutic activities;Therapeutic exercise;Balance training;Neuromuscular re-education;Patient/family education;Manual techniques;Passive range of motion;Dry needling;Taping;Spinal Manipulations    PT Next Visit Plan  progress lumbopelvic stretching and strengthening; review posture & body mechanics education PRN; manual therapy & modalities PRN    Consulted and Agree with Plan of Care  Patient       Patient will benefit from skilled therapeutic intervention in order  to improve the following deficits and impairments:  Decreased activity tolerance,  Decreased mobility, Decreased range of motion, Decreased strength, Hypomobility, Increased muscle spasms, Impaired flexibility, Impaired sensation, Postural dysfunction, Improper body mechanics, Pain  Visit Diagnosis: Radiculopathy, lumbar region  Muscle weakness (generalized)  Muscle spasm of back     Problem List Patient Active Problem List   Diagnosis Date Noted  . Vitamin D deficiency 07/01/2017  . Insulin resistance 07/01/2017  . Class 2 obesity with serious comorbidity and body mass index (BMI) of 36.0 to 36.9 in adult 07/01/2017  . Other fatigue 06/17/2017  . Shortness of breath on exertion 06/17/2017  . Essential hypertension 06/17/2017  . Class 2 obesity with serious comorbidity and body mass index (BMI) of 37.0 to 37.9 in adult 06/17/2017  . Angioedema 01/16/2016  . Allergic rhinitis 01/16/2016  . Chronic urticaria 12/19/2015    Bess Harvest, PTA 06/30/19 6:20 PM   [facility] [address]  [place], [place], [address] Phone: [phone]   Fax:  [phone]  Name: JESUSITA LUCKENBAUGH MRN: [id number] Date of Birth: 1955-11-06

## 2019-07-02 ENCOUNTER — Ambulatory Visit: Payer: 59

## 2019-07-02 ENCOUNTER — Other Ambulatory Visit: Payer: Self-pay

## 2019-07-02 DIAGNOSIS — M6281 Muscle weakness (generalized): Secondary | ICD-10-CM

## 2019-07-02 DIAGNOSIS — M62830 Muscle spasm of back: Secondary | ICD-10-CM | POA: Diagnosis not present

## 2019-07-02 DIAGNOSIS — M5416 Radiculopathy, lumbar region: Secondary | ICD-10-CM

## 2019-07-02 NOTE — Therapy (Signed)
[facility] [address]  [place], [place], [address] Phone: [phone]   Fax:  [phone]  Physical Therapy Treatment  Patient Details  Name: Andrea [name] MRN: [id number] Date of Birth: 02-08-1956 Referring Provider (PT): Kary Kos, MD   Encounter Date: 07/02/2019  PT End of Session - 07/02/19 1055    Visit Number  4    Number of Visits  12    Date for PT Re-Evaluation  07/28/19    Authorization Type  [facility]    PT Start Time  1016    PT Stop Time  1110   Ended visit with 10 min moist heat   PT Time Calculation (min)  54 min    Activity Tolerance  Patient tolerated treatment well    Behavior During Therapy  [facility] for tasks assessed/performed       Past Medical History:  Diagnosis Date  . Gallbladder problem   . GERD (gastroesophageal reflux disease)   . Hyperlipidemia   . Hypertension   . Joint pain   . Leg edema   . Multiple food allergies   . Obesity     Past Surgical History:  Procedure Laterality Date  . APPENDECTOMY    . CESAREAN SECTION    . HERNIA REPAIR    . LAPAROSCOPIC CHOLECYSTECTOMY    . POSTERIOR TIBIAL TENDON REPAIR    . TONSILECTOMY, ADENOIDECTOMY, BILATERAL MYRINGOTOMY AND TUBES  1983    There were no vitals filed for this visit.  Subjective Assessment - 07/02/19 1054    Subjective  Pt. noting she has had the most benefit from manual massage to buttocks performed two visits ago.    Patient Stated Goals  "pain to go away so I can sleep"    Currently in Pain?  Yes    Pain Score  5     Pain Location  Buttocks    Pain Orientation  Right;Mid    Pain Descriptors / Indicators  Aching    Pain Type  Acute pain;Chronic pain    Multiple Pain Sites  No                       [facility] Adult PT Treatment/Exercise - 07/02/19 0001      Lumbar Exercises: Stretches   Passive Hamstring Stretch  Right;30 seconds;1 rep    Passive Hamstring Stretch Limitations  supine with strap    Piriformis Stretch  Right;30 seconds;3 reps    Piriformis Stretch Limitations  supine KTOS    Figure 4 Stretch  30 seconds;1 rep;Supine;With overpressure    Figure 4 Stretch Limitations  figure 4 to chest    Other Lumbar Stretch Exercise  Seated R modified piriformis strech 2 x 30 sec       Lumbar Exercises: Aerobic   Nustep  L4 x 6 min      Lumbar Exercises: Supine   Bridge  3 seconds   x 12 reps    Bridge Limitations  + hip abduction isometric with red TB      Lumbar Exercises: Sidelying   Clam  Right   x 12 reps   Clam Limitations  red TB       Modalities   Modalities  Moist Heat      Moist Heat Therapy   Number Minutes Moist Heat  10 Minutes    Moist Heat Location  Hip   R buttocks in L sidelying with R LE elevated on  bolster      Manual Therapy   Manual Therapy  Soft tissue mobilization;Myofascial release    Manual therapy comments  L sidelying with R LE supported on bolster    Soft tissue mobilization  STM/DTM to R glutes and piriformis    Myofascial Release  manual TPR to glute minimus & piriformis             PT Education - 07/02/19 1158    Education Details  HEP update: modified piriformis seated stretfch    Person(s) Educated  Patient    Methods  Explanation;Demonstration;Verbal cues;Handout    Comprehension  Verbalized understanding;Returned demonstration;Verbal cues required       PT Short Term Goals - 06/23/19 1050      PT SHORT TERM GOAL #1   Title  Patient will be independent with initial HEP    Status  On-going    Target Date  07/07/19      PT SHORT TERM GOAL #2   Title  Patient will verbalize/demonstrate understanding of neutral spine posture and proper body mechanics to reduce strain on lumbar spine    Status  On-going    Target Date  07/07/19        PT Long Term Goals - 06/23/19 1050      PT LONG TERM GOAL #1   Title  Patient will be independent with ongoing/advanced HEP    Status  On-going    Target Date  07/28/19      PT LONG  TERM GOAL #2   Title  Patient to demonstrate appropriate posture and body mechanics needed for daily activities    Status  On-going    Target Date  07/28/19      PT LONG TERM GOAL #3   Title  B proximal LE strength >/= 4+/5 for improved stability    Status  On-going    Target Date  07/28/19      PT LONG TERM GOAL #4   Title  Patient will report ability to sit for >/= 30-45 minutes w/o limitation due to lumbar radiculopathy    Status  On-going    Target Date  07/28/19      PT LONG TERM GOAL #5   Title  Patient to report ability to perform work and daily activities without pain provocation    Status  On-going    Target Date  07/28/19      PT LONG TERM GOAL #6   Title  Patient will report ability to fall asleep in a timely manner w/o limitation due to lumbar radiculopathy    Status  On-going    Target Date  07/28/19            Plan - 07/02/19 1159    Clinical Impression Statement  Andrea [name] reporting she had to stand during work meeting earlier today as she could not tolerated sitting due to R buttocks pain.  MT addressing increased tension and palpable TP in R piriformis and lateral glutes today with improved comfort noted by pt. with self-ball massage on wall following.  wrapped up session with gentle proximal hip strengthening and stretching focusing on glutes and Ended visit with moist heat to R buttocks to improved tone and relax musculature.  Pt. leaving session pain free with subjective report of pain decreasing to 0/10 from 5/10 initial pain report.    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; h/o posterior  tibial tendon repair for atraumatic rupture; HTN; obesity    Rehab Potential  Good    PT Treatment/Interventions  ADLs/Self Care Home Management;Cryotherapy;Electrical Stimulation;Iontophoresis 4mg /ml Dexamethasone;Moist Heat;Traction;Ultrasound;Gait  training;Functional mobility training;Therapeutic activities;Therapeutic exercise;Balance training;Neuromuscular re-education;Patient/family education;Manual techniques;Passive range of motion;Dry needling;Taping;Spinal Manipulations    PT Next Visit Plan  progress lumbopelvic stretching and strengthening; review posture & body mechanics education PRN; manual therapy & modalities PRN    Consulted and Agree with Plan of Care  Patient       Patient will benefit from skilled therapeutic intervention in order to improve the following deficits and impairments:  Decreased activity tolerance, Decreased mobility, Decreased range of motion, Decreased strength, Hypomobility, Increased muscle spasms, Impaired flexibility, Impaired sensation, Postural dysfunction, Improper body mechanics, Pain  Visit Diagnosis: Radiculopathy, lumbar region  Muscle weakness (generalized)  Muscle spasm of back     Problem List Patient Active Problem List   Diagnosis Date Noted  . Vitamin D deficiency 07/01/2017  . Insulin resistance 07/01/2017  . Class 2 obesity with serious comorbidity and body mass index (BMI) of 36.0 to 36.9 in adult 07/01/2017  . Other fatigue 06/17/2017  . Shortness of breath on exertion 06/17/2017  . Essential hypertension 06/17/2017  . Class 2 obesity with serious comorbidity and body mass index (BMI) of 37.0 to 37.9 in adult 06/17/2017  . Angioedema 01/16/2016  . Allergic rhinitis 01/16/2016  . Chronic urticaria 12/19/2015    Bess Harvest, PTA 07/02/19 12:06 PM   [facility] [address]  [place], [place], [address] Phone: [phone]   Fax:  [phone]  Name: Andrea [name] MRN: [id number] Date of Birth: 1956-08-07

## 2019-07-05 ENCOUNTER — Encounter: Payer: Self-pay | Admitting: Physical Therapy

## 2019-07-05 ENCOUNTER — Ambulatory Visit: Payer: 59 | Admitting: Physical Therapy

## 2019-07-05 ENCOUNTER — Other Ambulatory Visit: Payer: Self-pay

## 2019-07-05 DIAGNOSIS — M62830 Muscle spasm of back: Secondary | ICD-10-CM

## 2019-07-05 DIAGNOSIS — M5416 Radiculopathy, lumbar region: Secondary | ICD-10-CM | POA: Diagnosis not present

## 2019-07-05 DIAGNOSIS — M6281 Muscle weakness (generalized): Secondary | ICD-10-CM

## 2019-07-05 NOTE — Therapy (Signed)
[facility] [address]  [place], [place], [address] Phone: [phone]   Fax:  [phone]  Physical Therapy Treatment  Patient Details  Name: Andrea [name] MRN: [id number] Date of Birth: Oct 13, 1956 Referring Provider (PT): Kary Kos, MD   Encounter Date: 07/05/2019  PT End of Session - 07/05/19 1614    Visit Number  5    Number of Visits  12    Date for PT Re-Evaluation  07/28/19    Authorization Type  [facility]    PT Start Time  1614    PT Stop Time  1656    PT Time Calculation (min)  42 min    Activity Tolerance  Patient tolerated treatment well    Behavior During Therapy  [facility] for tasks assessed/performed       Past Medical History:  Diagnosis Date  . Gallbladder problem   . GERD (gastroesophageal reflux disease)   . Hyperlipidemia   . Hypertension   . Joint pain   . Leg edema   . Multiple food allergies   . Obesity     Past Surgical History:  Procedure Laterality Date  . APPENDECTOMY    . CESAREAN SECTION    . HERNIA REPAIR    . LAPAROSCOPIC CHOLECYSTECTOMY    . POSTERIOR TIBIAL TENDON REPAIR    . TONSILECTOMY, ADENOIDECTOMY, BILATERAL MYRINGOTOMY AND TUBES  1983    There were no vitals filed for this visit.  Subjective Assessment - 07/05/19 1618    Subjective  Pt reporting everything has been good yesterday & today, but had a rough time Friday night & Saturday with constant increased pain..    Patient Stated Goals  "pain to go away so I can sleep"    Currently in Pain?  No/denies                       [facility] Adult PT Treatment/Exercise - 07/05/19 1614      Exercises   Exercises  Lumbar      Lumbar Exercises: Stretches   Piriformis Stretch  Right;30 seconds;3 reps    Piriformis Stretch Limitations  seated hip hinge with R hip ER onto mat table       Lumbar Exercises: Aerobic   Nustep  L5 x 6 min (UE/LE to reduce vibration)      Lumbar Exercises: Standing   Heel Raises   15 reps;3 seconds    Heel Raises Limitations  cues for abd bracing + quad and glute set; UE support on counter    Functional Squats  15 reps;3 seconds    Functional Squats Limitations  counter squat - cues for proiper hip and knee alignment    Other Standing Lumbar Exercises  B side stepping & fwd/back monster walk with looped red TB at ankles 2 x 30 ft      Lumbar Exercises: Seated   Long Arc Quad on [place]  Both;10 reps    LAQ on [employer] (lbs)  green Pball    Hip Flexion on Ball  Both;10 reps   2 sets   Hip Flexion on Ball Limitations  2nd set with alt UE raise; seated in green Pball    Other Seated Lumbar Exercises  B rows & scap retraction/shoulder extension with red TB and abd bracing x15 each, B pallof press with doulble red TB x 15; seated on green Pball      Lumbar Exercises: Quadruped   Straight Leg  Raise  10 reps;2 seconds    Other Quadruped Lumbar Exercises  Alt fire hydrants 10 x 2 sec             PT Education - 07/05/19 1706    Education Details  HEP update - quadruped hip extension    Person(s) Educated  Patient    Methods  Explanation;Demonstration;Handout    Comprehension  Verbalized understanding;Returned demonstration       PT Short Term Goals - 07/05/19 1619      PT SHORT TERM GOAL #1   Title  Patient will be independent with initial HEP    Status  Achieved      PT SHORT TERM GOAL #2   Title  Patient will verbalize/demonstrate understanding of neutral spine posture and proper body mechanics to reduce strain on lumbar spine    Status  Achieved        PT Long Term Goals - 06/23/19 1050      PT LONG TERM GOAL #1   Title  Patient will be independent with ongoing/advanced HEP    Status  On-going    Target Date  07/28/19      PT LONG TERM GOAL #2   Title  Patient to demonstrate appropriate posture and body mechanics needed for daily activities    Status  On-going    Target Date  07/28/19      PT LONG TERM GOAL #3   Title  B proximal LE  strength >/= 4+/5 for improved stability    Status  On-going    Target Date  07/28/19      PT LONG TERM GOAL #4   Title  Patient will report ability to sit for >/= 30-45 minutes w/o limitation due to lumbar radiculopathy    Status  On-going    Target Date  07/28/19      PT LONG TERM GOAL #5   Title  Patient to report ability to perform work and daily activities without pain provocation    Status  On-going    Target Date  07/28/19      PT LONG TERM GOAL #6   Title  Patient will report ability to fall asleep in a timely manner w/o limitation due to lumbar radiculopathy    Status  On-going    Target Date  07/28/19            Plan - 07/05/19 1620    Clinical Impression Statement  [name] reporting she has been pain free for yesterday and today, but reports significant increased pain Friday night and throughout the day on Saturday for which she was uncertain of the cause but thinks it may have been from the manual therapy last PT visit. Good tolerance for exercise progression today with introduction of more upright core and proximal LE strengthening - HEP updated with quadruped hip extension at patient request. She denies need for review of initial HEP and/or body mechanics training. Patient remaining pain free at end of session and appears to be progressing well per POC with STGs now met.    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; h/o posterior tibial tendon repair for atraumatic rupture; HTN; obesity    Rehab Potential  Good    PT Frequency  2x / week    PT Duration  6 weeks    PT Treatment/Interventions  ADLs/Self Care Home Management;Cryotherapy;Electrical Stimulation;Iontophoresis '4mg'$ /ml Dexamethasone;Moist Heat;Traction;Ultrasound;Gait training;Functional mobility training;Therapeutic activities;Therapeutic exercise;Balance training;Neuromuscular re-education;Patient/family education;Manual techniques;Passive range of motion;Dry needling;Taping;Spinal Manipulations  PT Next Visit Plan  progress lumbopelvic stretching and strengthening; review HEPs and posture & body mechanics education PRN; manual therapy & modalities PRN    Consulted and Agree with Plan of Care  Patient       Patient will benefit from skilled therapeutic intervention in order to improve the following deficits and impairments:  Decreased activity tolerance, Decreased mobility, Decreased range of motion, Decreased strength, Hypomobility, Increased muscle spasms, Impaired flexibility, Impaired sensation, Postural dysfunction, Improper body mechanics, Pain  Visit Diagnosis: Radiculopathy, lumbar region  Muscle weakness (generalized)  Muscle spasm of back     Problem List Patient Active Problem List   Diagnosis Date Noted  . Vitamin D deficiency 07/01/2017  . Insulin resistance 07/01/2017  . Class 2 obesity with serious comorbidity and body mass index (BMI) of 36.0 to 36.9 in adult 07/01/2017  . Other fatigue 06/17/2017  . Shortness of breath on exertion 06/17/2017  . Essential hypertension 06/17/2017  . Class 2 obesity with serious comorbidity and body mass index (BMI) of 37.0 to 37.9 in adult 06/17/2017  . Angioedema 01/16/2016  . Allergic rhinitis 01/16/2016  . Chronic urticaria 12/19/2015    Percival Spanish, PT, MPT 07/05/2019, 5:16 PM  [facility] [address]  [place], [place], [address] Phone: [phone]   Fax:  [phone]  Name: DANECIA UNDERDOWN MRN: [id number] Date of Birth: 12-06-1955

## 2019-07-08 ENCOUNTER — Other Ambulatory Visit: Payer: Self-pay

## 2019-07-08 ENCOUNTER — Encounter: Payer: Self-pay | Admitting: Physical Therapy

## 2019-07-08 ENCOUNTER — Ambulatory Visit: Payer: 59 | Admitting: Physical Therapy

## 2019-07-08 DIAGNOSIS — M5416 Radiculopathy, lumbar region: Secondary | ICD-10-CM | POA: Diagnosis not present

## 2019-07-08 DIAGNOSIS — M62830 Muscle spasm of back: Secondary | ICD-10-CM

## 2019-07-08 DIAGNOSIS — M6281 Muscle weakness (generalized): Secondary | ICD-10-CM

## 2019-07-08 NOTE — Therapy (Addendum)
[facility] [address]  [place], [place], [address] Phone: [phone]   Fax:  [phone]  Physical Therapy Treatment / Discharge Summary  Patient Details  Name: Andrea [name] MRN: [id number] Date of Birth: [birth year] Referring Provider (PT): Kary Kos, MD   Encounter Date: 07/08/2019  PT End of Session - 07/08/19 1020    Visit Number  6    Number of Visits  12    Date for PT Re-Evaluation  07/28/19    Authorization Type  [facility]    PT Start Time  1020    PT Stop Time  1107    PT Time Calculation (min)  47 min    Activity Tolerance  Patient tolerated treatment well    Behavior During Therapy  [facility] for tasks assessed/performed       Past Medical History:  Diagnosis Date  . Gallbladder problem   . GERD (gastroesophageal reflux disease)   . Hyperlipidemia   . Hypertension   . Joint pain   . Leg edema   . Multiple food allergies   . Obesity     Past Surgical History:  Procedure Laterality Date  . APPENDECTOMY    . CESAREAN SECTION    . HERNIA REPAIR    . LAPAROSCOPIC CHOLECYSTECTOMY    . POSTERIOR TIBIAL TENDON REPAIR    . TONSILECTOMY, ADENOIDECTOMY, BILATERAL MYRINGOTOMY AND TUBES  1983    There were no vitals filed for this visit.  Subjective Assessment - 07/08/19 1022    Subjective  Pt reports her back has been doing good this week - Saturday was the last time she had any pain.    How long can you sit comfortably?  2.5 hrs    Patient Stated Goals  "pain to go away so I can sleep"    Currently in Pain?  No/denies         [facility] PT Assessment - 07/08/19 1020      Assessment   Medical Diagnosis  B lumbar radiculopathy    Referring Provider (PT)  Kary Kos, MD    Onset Date/Surgical Date  --   ~1 month   Next MD Visit  07/29/19 as needed      Observation/Other Assessments   Focus on Therapeutic Outcomes (FOTO)   Lumbar - 94% (6% limitation)      Strength   Right Hip Flexion  4+/5    Right  Hip Extension  4-/5    Right Hip External Rotation   4/5    Right Hip Internal Rotation  4/5    Right Hip ABduction  4-/5    Right Hip ADduction  4/5    Left Hip Flexion  4+/5    Left Hip Extension  4-/5    Left Hip External Rotation  4/5    Left Hip Internal Rotation  4/5    Left Hip ABduction  4-/5    Left Hip ADduction  4/5    Right Knee Flexion  4+/5    Right Knee Extension  5/5    Left Knee Flexion  4+/5    Left Knee Extension  4+/5                   [facility] Adult PT Treatment/Exercise - 07/08/19 1020      Exercises   Exercises  Lumbar      Lumbar Exercises: Aerobic   Nustep  L5 x 6 min (UE/LE to reduce vibration)  Knee/Hip Exercises: Standing   Hip Flexion  Right;Left;10 reps;Knee straight;Stengthening    Hip Flexion Limitations  red TB, UE support on back of chair    Hip ADduction  Right;Left;10 reps;Strengthening    Hip ADduction Limitations  red TB, UE support on back of chair    Hip Abduction  Right;Left;10 reps;Knee straight;Stengthening    Abduction Limitations  red TB, UE support on back of chair    Hip Extension  Right;Left;10 reps;Knee straight;Stengthening    Extension Limitations  red TB, UE support on back of chair             PT Education - 07/08/19 1100    Education Details  HEP review/update - looped green TB provided for clams &/or progression to bridge clam with red TB, addition of standing 4-way hip strengthening; Instruction in gradual resumption of home walking program    Person(s) Educated  Patient    Methods  Explanation;Demonstration;Handout    Comprehension  Verbalized understanding;Returned demonstration       PT Short Term Goals - 07/05/19 1619      PT SHORT TERM GOAL #1   Title  Patient will be independent with initial HEP    Status  Achieved      PT SHORT TERM GOAL #2   Title  Patient will verbalize/demonstrate understanding of neutral spine posture and proper body mechanics to reduce strain on lumbar spine     Status  Achieved        PT Long Term Goals - 07/08/19 1027      PT LONG TERM GOAL #1   Title  Patient will be independent with ongoing/advanced HEP    Status  Achieved      PT LONG TERM GOAL #2   Title  Patient to demonstrate appropriate posture and body mechanics needed for daily activities    Status  Achieved      PT LONG TERM GOAL #3   Title  B proximal LE strength >/= 4+/5 for improved stability    Status  Partially Met    Target Date  07/28/19      PT LONG TERM GOAL #4   Title  Patient will report ability to sit for >/= 30-45 minutes w/o limitation due to lumbar radiculopathy    Status  Achieved      PT LONG TERM GOAL #5   Title  Patient to report ability to perform work and daily activities without pain provocation    Status  Achieved      PT LONG TERM GOAL #6   Title  Patient will report ability to fall asleep in a timely manner w/o limitation due to lumbar radiculopathy    Status  Achieved            Plan - 07/08/19 1023    Clinical Impression Statement  Andrea [name] reports she has been pain free w/o need for meds since Saturday and has been able to sit for up to 2.5 hours w/o limitation due to LBP or lumbar radiculopathy. She notes improved overall awareness of posture and body mechanics with daily activities and reports no restriction with work or household daily tasks this week with FOTO reduced to only 6% limitation. Overall LE strength improved but continued mild proximal weakness still present, therefore HEP reviewed and updated with progression of existing exercises as well as addition of further hip strengthening along with discussion of gradual resumption of home walking program. All goals met with exception of proximal LE strength. Andrea [name]  expressing interest in transitioning to HEP at this time but would like to remain on hold for 30 days in the event that pain returns or issues arise with ongoing HEP or resumption of daily walking program.    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; h/o posterior tibial tendon repair for atraumatic rupture; HTN; obesity    Rehab Potential  Good    PT Frequency  2x / week    PT Duration  6 weeks    PT Treatment/Interventions  ADLs/Self Care Home Management;Cryotherapy;Electrical Stimulation;Iontophoresis '4mg'$ /ml Dexamethasone;Moist Heat;Traction;Ultrasound;Gait training;Functional mobility training;Therapeutic activities;Therapeutic exercise;Balance training;Neuromuscular re-education;Patient/family education;Manual techniques;Passive range of motion;Dry needling;Taping;Spinal Manipulations    PT Next Visit Plan  30-day hold    Consulted and Agree with Plan of Care  Patient       Patient will benefit from skilled therapeutic intervention in order to improve the following deficits and impairments:  Decreased activity tolerance, Decreased mobility, Decreased range of motion, Decreased strength, Hypomobility, Increased muscle spasms, Impaired flexibility, Impaired sensation, Postural dysfunction, Improper body mechanics, Pain  Visit Diagnosis: Radiculopathy, lumbar region  Muscle weakness (generalized)  Muscle spasm of back     Problem List Patient Active Problem List   Diagnosis Date Noted  . Vitamin D deficiency 07/01/2017  . Insulin resistance 07/01/2017  . Class 2 obesity with serious comorbidity and body mass index (BMI) of 36.0 to 36.9 in adult 07/01/2017  . Other fatigue 06/17/2017  . Shortness of breath on exertion 06/17/2017  . Essential hypertension 06/17/2017  . Class 2 obesity with serious comorbidity and body mass index (BMI) of 37.0 to 37.9 in adult 06/17/2017  . Angioedema 01/16/2016  . Allergic rhinitis 01/16/2016  . Chronic urticaria 12/19/2015    Percival Spanish, PT, MPT 07/08/2019, 11:56 AM  [facility] [address]  [place], [place], [address] Phone: [phone]   Fax:  [phone]  Name: Andrea [name] MRN: [id number] Date of Birth: [birth year]  PHYSICAL THERAPY DISCHARGE SUMMARY  Visits from Start of Care: 6  Current functional level related to goals / functional outcomes:   Refer to above clinical impression for status as of last visit on 07/08/2019. Patient was placed on hold for 30 days and has not needed to return to PT, therefore will proceed with discharge from PT for this episode.   Remaining deficits:   As above.   Education / Equipment:   HEP  Plan: Patient agrees to discharge.  Patient goals were mostly met. Patient is being discharged due to being pleased with the current functional level.  ?????    Percival Spanish, PT, MPT 08/13/19, 8:36 AM  [facility] [address]  [place], [place], [address] Phone: [phone]   Fax:  [phone]

## 2019-07-16 ENCOUNTER — Encounter: Payer: 59 | Admitting: Physical Therapy

## 2019-07-20 MED FILL — VALSARTAN-HCTZ 80-12.5 MG T: 80-12.5 | 30 days supply | Qty: 30 | Fill #1

## 2019-08-20 MED FILL — VALSARTAN-HCTZ 80-12.5 MG T: 80-12.5 | 30 days supply | Qty: 30 | Fill #2

## 2019-08-23 DIAGNOSIS — H5203 Hypermetropia, bilateral: Secondary | ICD-10-CM | POA: Diagnosis not present

## 2019-08-23 DIAGNOSIS — H52223 Regular astigmatism, bilateral: Secondary | ICD-10-CM | POA: Diagnosis not present

## 2019-08-23 DIAGNOSIS — H524 Presbyopia: Secondary | ICD-10-CM | POA: Diagnosis not present

## 2019-09-20 MED FILL — VALSARTAN-HCTZ 80-12.5 MG T: 80-12.5 | 30 days supply | Qty: 30 | Fill #3

## 2019-10-25 MED FILL — VALSARTAN-HCTZ 80-12.5 MG T: 80-12.5 | 30 days supply | Qty: 30 | Fill #4

## 2019-10-27 DIAGNOSIS — M25561 Pain in right knee: Secondary | ICD-10-CM | POA: Diagnosis not present

## 2019-10-27 DIAGNOSIS — M25562 Pain in left knee: Secondary | ICD-10-CM | POA: Diagnosis not present

## 2019-11-17 DIAGNOSIS — Z0000 Encounter for general adult medical examination without abnormal findings: Secondary | ICD-10-CM | POA: Diagnosis not present

## 2019-11-17 DIAGNOSIS — Z6839 Body mass index (BMI) 39.0-39.9, adult: Secondary | ICD-10-CM | POA: Diagnosis not present

## 2019-11-17 DIAGNOSIS — I10 Essential (primary) hypertension: Secondary | ICD-10-CM | POA: Diagnosis not present

## 2019-11-17 DIAGNOSIS — N950 Postmenopausal bleeding: Secondary | ICD-10-CM | POA: Diagnosis not present

## 2019-11-18 ENCOUNTER — Other Ambulatory Visit (HOSPITAL_BASED_OUTPATIENT_CLINIC_OR_DEPARTMENT_OTHER): Payer: Self-pay | Admitting: Physician Assistant

## 2019-11-18 DIAGNOSIS — Z1231 Encounter for screening mammogram for malignant neoplasm of breast: Secondary | ICD-10-CM

## 2019-11-19 ENCOUNTER — Ambulatory Visit (HOSPITAL_BASED_OUTPATIENT_CLINIC_OR_DEPARTMENT_OTHER)
Admission: RE | Admit: 2019-11-19 | Discharge: 2019-11-19 | Disposition: A | Discharge status: Home / Self Care | Payer: 59 | Source: Ambulatory Visit | Attending: Physician Assistant | Admitting: Physician Assistant

## 2019-11-19 ENCOUNTER — Other Ambulatory Visit: Payer: Self-pay

## 2019-11-19 DIAGNOSIS — Z1231 Encounter for screening mammogram for malignant neoplasm of breast: Secondary | ICD-10-CM | POA: Insufficient documentation

## 2019-11-22 DIAGNOSIS — I10 Essential (primary) hypertension: Secondary | ICD-10-CM | POA: Diagnosis not present

## 2019-11-22 DIAGNOSIS — Z13220 Encounter for screening for lipoid disorders: Secondary | ICD-10-CM | POA: Diagnosis not present

## 2019-11-22 DIAGNOSIS — Z0000 Encounter for general adult medical examination without abnormal findings: Secondary | ICD-10-CM | POA: Diagnosis not present

## 2019-11-25 MED FILL — VALSARTAN-HCTZ 80-12.5 MG T: 80-12.5 | 30 days supply | Qty: 30 | Fill #5

## 2019-12-23 MED FILL — VALSARTAN-HCTZ 80-12.5 MG T: 80-12.5 | 30 days supply | Qty: 30 | Fill #6

## 2020-01-11 DIAGNOSIS — Z23 Encounter for immunization: Secondary | ICD-10-CM | POA: Diagnosis not present

## 2020-01-24 MED FILL — VALSARTAN-HCTZ 80-12.5 MG T: 80-12.5 | 30 days supply | Qty: 30 | Fill #7

## 2020-02-24 MED FILL — VALSARTAN-HCTZ 80-12.5 MG T: 80-12.5 | 30 days supply | Qty: 30 | Fill #8

## 2020-03-27 ENCOUNTER — Other Ambulatory Visit (HOSPITAL_COMMUNITY): Payer: Self-pay | Admitting: Physician Assistant

## 2020-05-19 DIAGNOSIS — Z23 Encounter for immunization: Secondary | ICD-10-CM | POA: Diagnosis not present

## 2020-05-19 DIAGNOSIS — I10 Essential (primary) hypertension: Secondary | ICD-10-CM | POA: Diagnosis not present

## 2020-05-24 DIAGNOSIS — Z20822 Contact with and (suspected) exposure to covid-19: Secondary | ICD-10-CM | POA: Diagnosis not present

## 2020-05-25 DIAGNOSIS — D1801 Hemangioma of skin and subcutaneous tissue: Secondary | ICD-10-CM | POA: Diagnosis not present

## 2020-05-25 DIAGNOSIS — C44619 Basal cell carcinoma of skin of left upper limb, including shoulder: Secondary | ICD-10-CM | POA: Diagnosis not present

## 2020-06-21 MED FILL — VALSARTAN-HCTZ 80-12.5 MG T: 80-12.5 | 90 days supply | Qty: 90 | Fill #0

## 2020-08-23 DIAGNOSIS — H5203 Hypermetropia, bilateral: Secondary | ICD-10-CM | POA: Diagnosis not present

## 2020-08-23 DIAGNOSIS — H52223 Regular astigmatism, bilateral: Secondary | ICD-10-CM | POA: Diagnosis not present

## 2020-08-23 DIAGNOSIS — H524 Presbyopia: Secondary | ICD-10-CM | POA: Diagnosis not present

## 2020-09-11 DIAGNOSIS — R1032 Left lower quadrant pain: Secondary | ICD-10-CM | POA: Diagnosis not present

## 2020-09-26 MED FILL — VALSARTAN-HCTZ 80-12.5 MG T: 80-12.5 | 90 days supply | Qty: 90 | Fill #1

## 2020-10-24 ENCOUNTER — Other Ambulatory Visit: Payer: Self-pay | Admitting: Family Medicine

## 2020-10-24 DIAGNOSIS — Z1231 Encounter for screening mammogram for malignant neoplasm of breast: Secondary | ICD-10-CM

## 2020-11-14 ENCOUNTER — Other Ambulatory Visit (HOSPITAL_COMMUNITY): Payer: Self-pay | Admitting: Family Medicine

## 2020-11-14 DIAGNOSIS — R109 Unspecified abdominal pain: Secondary | ICD-10-CM | POA: Diagnosis not present

## 2020-11-14 DIAGNOSIS — I10 Essential (primary) hypertension: Secondary | ICD-10-CM | POA: Diagnosis not present

## 2020-11-14 DIAGNOSIS — E669 Obesity, unspecified: Secondary | ICD-10-CM | POA: Diagnosis not present

## 2020-11-17 ENCOUNTER — Other Ambulatory Visit (HOSPITAL_COMMUNITY): Payer: Self-pay | Admitting: Family Medicine

## 2020-11-17 MED FILL — METFORMIN HCL 500 MG TABS: 500 | 90 days supply | Qty: 180 | Fill #0

## 2020-12-04 ENCOUNTER — Ambulatory Visit
Admission: RE | Admit: 2020-12-04 | Discharge: 2020-12-04 | Disposition: A | Discharge status: Home / Self Care | Payer: 59 | Source: Ambulatory Visit | Attending: Family Medicine | Admitting: Family Medicine

## 2020-12-04 ENCOUNTER — Other Ambulatory Visit: Payer: Self-pay

## 2020-12-04 DIAGNOSIS — Z1231 Encounter for screening mammogram for malignant neoplasm of breast: Secondary | ICD-10-CM | POA: Diagnosis not present

## 2020-12-25 MED FILL — VALSARTAN-HCTZ 80-12.5 MG T: 80-12.5 | 90 days supply | Qty: 90 | Fill #2

## 2021-01-11 ENCOUNTER — Other Ambulatory Visit (HOSPITAL_BASED_OUTPATIENT_CLINIC_OR_DEPARTMENT_OTHER): Payer: Self-pay

## 2021-01-12 ENCOUNTER — Other Ambulatory Visit (HOSPITAL_BASED_OUTPATIENT_CLINIC_OR_DEPARTMENT_OTHER): Payer: Self-pay

## 2021-02-12 DIAGNOSIS — E669 Obesity, unspecified: Secondary | ICD-10-CM | POA: Diagnosis not present

## 2021-02-12 DIAGNOSIS — Z6839 Body mass index (BMI) 39.0-39.9, adult: Secondary | ICD-10-CM | POA: Diagnosis not present

## 2021-02-12 DIAGNOSIS — I10 Essential (primary) hypertension: Secondary | ICD-10-CM | POA: Diagnosis not present

## 2021-03-27 ENCOUNTER — Other Ambulatory Visit (HOSPITAL_COMMUNITY): Payer: Self-pay

## 2021-03-27 MED FILL — Valsartan-Hydrochlorothiazide Tab 80-12.5 MG: ORAL | 90 days supply | Qty: 90 | Fill #0 | Status: AC

## 2021-04-25 ENCOUNTER — Other Ambulatory Visit (HOSPITAL_COMMUNITY): Payer: Self-pay

## 2021-04-25 MED FILL — Metformin HCl Tab 500 MG: ORAL | 90 days supply | Qty: 180 | Fill #0 | Status: AC

## 2021-05-07 ENCOUNTER — Ambulatory Visit: Payer: 59 | Attending: Internal Medicine

## 2021-05-07 DIAGNOSIS — Z23 Encounter for immunization: Secondary | ICD-10-CM

## 2021-05-07 NOTE — Progress Notes (Signed)
   Covid-19 Vaccination Clinic  Name:  Andrea [name]    MRN: [id number] DOB: 1955/11/03  05/07/2021  Andrea [name] was observed post Covid-19 immunization for 15 minutes without incident. She was provided with Vaccine Information Sheet and instruction to access the V-Safe system.   Andrea [name] was instructed to call 911 with any severe reactions post vaccine: Difficulty breathing  Swelling of face and throat  A fast heartbeat  A bad rash all over body  Dizziness and weakness   Immunizations Administered     Name Date Dose VIS Date Route   PFIZER Comrnaty(Gray TOP) Covid-19 Vaccine 05/07/2021 10:03 AM 0.3 mL 09/28/2020 Intramuscular   Manufacturer: [place]   Lot: [id number]   [place]: [phone]

## 2021-05-11 ENCOUNTER — Other Ambulatory Visit (HOSPITAL_BASED_OUTPATIENT_CLINIC_OR_DEPARTMENT_OTHER): Payer: Self-pay

## 2021-05-11 MED ORDER — COVID-19 MRNA VAC-TRIS(PFIZER) 30 MCG/0.3ML IM SUSP
INTRAMUSCULAR | 0 refills | Status: AC
  Filled 2021-05-11: qty 0.3, 1d supply, fill #0

## 2021-06-14 DIAGNOSIS — L281 Prurigo nodularis: Secondary | ICD-10-CM | POA: Diagnosis not present

## 2021-06-14 DIAGNOSIS — L821 Other seborrheic keratosis: Secondary | ICD-10-CM | POA: Diagnosis not present

## 2021-06-14 DIAGNOSIS — D225 Melanocytic nevi of trunk: Secondary | ICD-10-CM | POA: Diagnosis not present

## 2021-06-14 DIAGNOSIS — L570 Actinic keratosis: Secondary | ICD-10-CM | POA: Diagnosis not present

## 2021-06-14 DIAGNOSIS — Z85828 Personal history of other malignant neoplasm of skin: Secondary | ICD-10-CM | POA: Diagnosis not present

## 2021-06-14 DIAGNOSIS — L814 Other melanin hyperpigmentation: Secondary | ICD-10-CM | POA: Diagnosis not present

## 2021-06-14 DIAGNOSIS — D692 Other nonthrombocytopenic purpura: Secondary | ICD-10-CM | POA: Diagnosis not present

## 2021-06-14 DIAGNOSIS — D485 Neoplasm of uncertain behavior of skin: Secondary | ICD-10-CM | POA: Diagnosis not present

## 2021-07-03 DIAGNOSIS — L03039 Cellulitis of unspecified toe: Secondary | ICD-10-CM | POA: Diagnosis not present

## 2021-08-15 DIAGNOSIS — R7303 Prediabetes: Secondary | ICD-10-CM | POA: Diagnosis not present

## 2021-08-15 DIAGNOSIS — B351 Tinea unguium: Secondary | ICD-10-CM | POA: Diagnosis not present

## 2021-08-15 DIAGNOSIS — I10 Essential (primary) hypertension: Secondary | ICD-10-CM | POA: Diagnosis not present

## 2021-08-15 DIAGNOSIS — Z23 Encounter for immunization: Secondary | ICD-10-CM | POA: Diagnosis not present

## 2021-08-15 DIAGNOSIS — Z79899 Other long term (current) drug therapy: Secondary | ICD-10-CM | POA: Diagnosis not present

## 2021-08-22 DIAGNOSIS — H43813 Vitreous degeneration, bilateral: Secondary | ICD-10-CM | POA: Diagnosis not present

## 2021-08-22 DIAGNOSIS — H04123 Dry eye syndrome of bilateral lacrimal glands: Secondary | ICD-10-CM | POA: Diagnosis not present

## 2021-08-22 DIAGNOSIS — H2513 Age-related nuclear cataract, bilateral: Secondary | ICD-10-CM | POA: Diagnosis not present

## 2021-08-29 ENCOUNTER — Ambulatory Visit: Payer: Self-pay | Attending: Internal Medicine

## 2021-08-29 DIAGNOSIS — Z23 Encounter for immunization: Secondary | ICD-10-CM

## 2021-08-29 NOTE — Progress Notes (Signed)
   Covid-19 Vaccination Clinic  Name:  Andrea [name]    MRN: [id number] DOB: 1956/09/27  08/29/2021  [name] was observed post Covid-19 immunization for 15 minutes without incident. She was provided with Vaccine Information Sheet and instruction to access the V-Safe system.   [name] was instructed to call 911 with any severe reactions post vaccine: Difficulty breathing  Swelling of face and throat  A fast heartbeat  A bad rash all over body  Dizziness and weakness   Immunizations Administered     Name Date Dose VIS Date Route   Pfizer Covid-19 Vaccine Bivalent Booster 08/29/2021  1:02 PM 0.3 mL 06/20/2021 Intramuscular   Manufacturer: [place]   Lot: [id number]   [place]: [place], PharmD, MBA Clinical Acute Care Pharmacist

## 2021-09-17 ENCOUNTER — Other Ambulatory Visit (HOSPITAL_BASED_OUTPATIENT_CLINIC_OR_DEPARTMENT_OTHER): Payer: Self-pay

## 2021-09-17 MED ORDER — PFIZER COVID-19 VAC BIVALENT 30 MCG/0.3ML IM SUSP
INTRAMUSCULAR | 0 refills | Status: AC
  Filled 2021-09-17: qty 0.3, 1d supply, fill #0

## 2021-11-27 ENCOUNTER — Other Ambulatory Visit (HOSPITAL_BASED_OUTPATIENT_CLINIC_OR_DEPARTMENT_OTHER): Payer: Self-pay | Admitting: Family Medicine

## 2021-11-27 DIAGNOSIS — Z1231 Encounter for screening mammogram for malignant neoplasm of breast: Secondary | ICD-10-CM

## 2021-11-29 DIAGNOSIS — E7800 Pure hypercholesterolemia, unspecified: Secondary | ICD-10-CM | POA: Diagnosis not present

## 2021-11-29 DIAGNOSIS — Z79899 Other long term (current) drug therapy: Secondary | ICD-10-CM | POA: Diagnosis not present

## 2021-12-05 ENCOUNTER — Other Ambulatory Visit: Payer: Self-pay

## 2021-12-05 ENCOUNTER — Ambulatory Visit (HOSPITAL_BASED_OUTPATIENT_CLINIC_OR_DEPARTMENT_OTHER)
Admission: RE | Admit: 2021-12-05 | Discharge: 2021-12-05 | Disposition: A | Discharge status: Home / Self Care | Payer: Medicare Other | Source: Ambulatory Visit | Attending: Family Medicine | Admitting: Family Medicine

## 2021-12-05 ENCOUNTER — Encounter (HOSPITAL_BASED_OUTPATIENT_CLINIC_OR_DEPARTMENT_OTHER): Payer: Self-pay

## 2021-12-05 DIAGNOSIS — Z1231 Encounter for screening mammogram for malignant neoplasm of breast: Secondary | ICD-10-CM | POA: Insufficient documentation

## 2022-01-23 DIAGNOSIS — E7800 Pure hypercholesterolemia, unspecified: Secondary | ICD-10-CM | POA: Diagnosis not present

## 2022-01-23 DIAGNOSIS — I10 Essential (primary) hypertension: Secondary | ICD-10-CM | POA: Diagnosis not present

## 2022-03-07 DIAGNOSIS — I10 Essential (primary) hypertension: Secondary | ICD-10-CM | POA: Diagnosis not present

## 2022-03-07 DIAGNOSIS — R5383 Other fatigue: Secondary | ICD-10-CM | POA: Diagnosis not present

## 2022-03-07 DIAGNOSIS — E559 Vitamin D deficiency, unspecified: Secondary | ICD-10-CM | POA: Diagnosis not present

## 2022-03-07 DIAGNOSIS — M25569 Pain in unspecified knee: Secondary | ICD-10-CM | POA: Diagnosis not present

## 2022-03-07 DIAGNOSIS — R0602 Shortness of breath: Secondary | ICD-10-CM | POA: Diagnosis not present

## 2022-03-07 DIAGNOSIS — Z1389 Encounter for screening for other disorder: Secondary | ICD-10-CM | POA: Diagnosis not present

## 2022-03-07 DIAGNOSIS — R748 Abnormal levels of other serum enzymes: Secondary | ICD-10-CM | POA: Diagnosis not present

## 2022-03-07 DIAGNOSIS — R7303 Prediabetes: Secondary | ICD-10-CM | POA: Diagnosis not present

## 2022-03-07 DIAGNOSIS — E7849 Other hyperlipidemia: Secondary | ICD-10-CM | POA: Diagnosis not present

## 2022-03-21 DIAGNOSIS — R7303 Prediabetes: Secondary | ICD-10-CM | POA: Diagnosis not present

## 2022-03-21 DIAGNOSIS — I10 Essential (primary) hypertension: Secondary | ICD-10-CM | POA: Diagnosis not present

## 2022-03-21 DIAGNOSIS — E7849 Other hyperlipidemia: Secondary | ICD-10-CM | POA: Diagnosis not present

## 2022-04-11 DIAGNOSIS — E7849 Other hyperlipidemia: Secondary | ICD-10-CM | POA: Diagnosis not present

## 2022-04-11 DIAGNOSIS — I10 Essential (primary) hypertension: Secondary | ICD-10-CM | POA: Diagnosis not present

## 2022-04-11 DIAGNOSIS — K5790 Diverticulosis of intestine, part unspecified, without perforation or abscess without bleeding: Secondary | ICD-10-CM | POA: Diagnosis not present

## 2022-04-11 DIAGNOSIS — R7303 Prediabetes: Secondary | ICD-10-CM | POA: Diagnosis not present

## 2022-05-07 DIAGNOSIS — E785 Hyperlipidemia, unspecified: Secondary | ICD-10-CM | POA: Diagnosis not present

## 2022-05-07 DIAGNOSIS — R7303 Prediabetes: Secondary | ICD-10-CM | POA: Diagnosis not present

## 2022-05-07 DIAGNOSIS — I10 Essential (primary) hypertension: Secondary | ICD-10-CM | POA: Diagnosis not present

## 2022-05-07 DIAGNOSIS — R609 Edema, unspecified: Secondary | ICD-10-CM | POA: Diagnosis not present

## 2022-05-22 DIAGNOSIS — R600 Localized edema: Secondary | ICD-10-CM | POA: Diagnosis not present

## 2022-05-22 DIAGNOSIS — R609 Edema, unspecified: Secondary | ICD-10-CM | POA: Diagnosis not present

## 2022-05-22 DIAGNOSIS — E785 Hyperlipidemia, unspecified: Secondary | ICD-10-CM | POA: Diagnosis not present

## 2022-05-22 DIAGNOSIS — I10 Essential (primary) hypertension: Secondary | ICD-10-CM | POA: Diagnosis not present

## 2022-05-22 DIAGNOSIS — M79671 Pain in right foot: Secondary | ICD-10-CM | POA: Diagnosis not present

## 2022-05-22 DIAGNOSIS — R7303 Prediabetes: Secondary | ICD-10-CM | POA: Diagnosis not present

## 2022-06-05 DIAGNOSIS — E65 Localized adiposity: Secondary | ICD-10-CM | POA: Diagnosis not present

## 2022-06-27 DIAGNOSIS — L814 Other melanin hyperpigmentation: Secondary | ICD-10-CM | POA: Diagnosis not present

## 2022-06-27 DIAGNOSIS — L821 Other seborrheic keratosis: Secondary | ICD-10-CM | POA: Diagnosis not present

## 2022-06-27 DIAGNOSIS — L570 Actinic keratosis: Secondary | ICD-10-CM | POA: Diagnosis not present

## 2022-06-27 DIAGNOSIS — D225 Melanocytic nevi of trunk: Secondary | ICD-10-CM | POA: Diagnosis not present

## 2022-06-27 DIAGNOSIS — L820 Inflamed seborrheic keratosis: Secondary | ICD-10-CM | POA: Diagnosis not present

## 2022-06-27 DIAGNOSIS — Z85828 Personal history of other malignant neoplasm of skin: Secondary | ICD-10-CM | POA: Diagnosis not present

## 2022-07-02 DIAGNOSIS — E65 Localized adiposity: Secondary | ICD-10-CM | POA: Diagnosis not present

## 2022-07-02 DIAGNOSIS — R7303 Prediabetes: Secondary | ICD-10-CM | POA: Diagnosis not present

## 2022-07-02 DIAGNOSIS — I10 Essential (primary) hypertension: Secondary | ICD-10-CM | POA: Diagnosis not present

## 2022-07-02 DIAGNOSIS — R600 Localized edema: Secondary | ICD-10-CM | POA: Diagnosis not present

## 2022-07-02 DIAGNOSIS — M79673 Pain in unspecified foot: Secondary | ICD-10-CM | POA: Diagnosis not present

## 2022-07-31 DIAGNOSIS — R7303 Prediabetes: Secondary | ICD-10-CM | POA: Diagnosis not present

## 2022-07-31 DIAGNOSIS — E7800 Pure hypercholesterolemia, unspecified: Secondary | ICD-10-CM | POA: Diagnosis not present

## 2022-07-31 DIAGNOSIS — I10 Essential (primary) hypertension: Secondary | ICD-10-CM | POA: Diagnosis not present

## 2022-08-05 DIAGNOSIS — J069 Acute upper respiratory infection, unspecified: Secondary | ICD-10-CM | POA: Diagnosis not present

## 2022-08-05 DIAGNOSIS — R600 Localized edema: Secondary | ICD-10-CM | POA: Diagnosis not present

## 2022-08-05 DIAGNOSIS — E65 Localized adiposity: Secondary | ICD-10-CM | POA: Diagnosis not present

## 2022-08-05 DIAGNOSIS — R7303 Prediabetes: Secondary | ICD-10-CM | POA: Diagnosis not present

## 2022-08-05 DIAGNOSIS — I10 Essential (primary) hypertension: Secondary | ICD-10-CM | POA: Diagnosis not present

## 2022-08-05 DIAGNOSIS — Z9189 Other specified personal risk factors, not elsewhere classified: Secondary | ICD-10-CM | POA: Diagnosis not present

## 2022-08-16 ENCOUNTER — Other Ambulatory Visit (HOSPITAL_BASED_OUTPATIENT_CLINIC_OR_DEPARTMENT_OTHER): Payer: Self-pay

## 2022-08-16 MED ORDER — AREXVY 120 MCG/0.5ML IM SUSR
INTRAMUSCULAR | 0 refills | Status: AC
  Filled 2022-08-16: qty 1, 1d supply, fill #0

## 2022-08-20 DIAGNOSIS — R600 Localized edema: Secondary | ICD-10-CM | POA: Diagnosis not present

## 2022-08-20 DIAGNOSIS — I10 Essential (primary) hypertension: Secondary | ICD-10-CM | POA: Diagnosis not present

## 2022-08-20 DIAGNOSIS — E65 Localized adiposity: Secondary | ICD-10-CM | POA: Diagnosis not present

## 2022-08-20 DIAGNOSIS — J069 Acute upper respiratory infection, unspecified: Secondary | ICD-10-CM | POA: Diagnosis not present

## 2022-08-20 DIAGNOSIS — R7303 Prediabetes: Secondary | ICD-10-CM | POA: Diagnosis not present

## 2022-08-28 DIAGNOSIS — H2513 Age-related nuclear cataract, bilateral: Secondary | ICD-10-CM | POA: Diagnosis not present

## 2022-08-28 DIAGNOSIS — H04123 Dry eye syndrome of bilateral lacrimal glands: Secondary | ICD-10-CM | POA: Diagnosis not present

## 2022-08-28 DIAGNOSIS — H43813 Vitreous degeneration, bilateral: Secondary | ICD-10-CM | POA: Diagnosis not present

## 2022-09-03 DIAGNOSIS — E7849 Other hyperlipidemia: Secondary | ICD-10-CM | POA: Diagnosis not present

## 2022-09-03 DIAGNOSIS — I10 Essential (primary) hypertension: Secondary | ICD-10-CM | POA: Diagnosis not present

## 2022-09-03 DIAGNOSIS — E65 Localized adiposity: Secondary | ICD-10-CM | POA: Diagnosis not present

## 2022-09-16 DIAGNOSIS — Z0000 Encounter for general adult medical examination without abnormal findings: Secondary | ICD-10-CM | POA: Diagnosis not present

## 2022-09-17 DIAGNOSIS — I10 Essential (primary) hypertension: Secondary | ICD-10-CM | POA: Diagnosis not present

## 2022-09-17 DIAGNOSIS — E8881 Metabolic syndrome: Secondary | ICD-10-CM | POA: Diagnosis not present

## 2022-10-07 DIAGNOSIS — E65 Localized adiposity: Secondary | ICD-10-CM | POA: Diagnosis not present

## 2022-10-07 DIAGNOSIS — I10 Essential (primary) hypertension: Secondary | ICD-10-CM | POA: Diagnosis not present

## 2022-10-07 DIAGNOSIS — R7303 Prediabetes: Secondary | ICD-10-CM | POA: Diagnosis not present

## 2022-10-23 DIAGNOSIS — M25562 Pain in left knee: Secondary | ICD-10-CM | POA: Diagnosis not present

## 2022-10-23 DIAGNOSIS — M25561 Pain in right knee: Secondary | ICD-10-CM | POA: Diagnosis not present

## 2022-10-30 ENCOUNTER — Emergency Department (HOSPITAL_BASED_OUTPATIENT_CLINIC_OR_DEPARTMENT_OTHER)
Admission: EM | Admit: 2022-10-30 | Discharge: 2022-10-30 | Disposition: A | Discharge status: Home / Self Care | Payer: Medicare Other | Attending: Emergency Medicine | Admitting: Emergency Medicine

## 2022-10-30 ENCOUNTER — Other Ambulatory Visit: Payer: Self-pay

## 2022-10-30 ENCOUNTER — Emergency Department (HOSPITAL_BASED_OUTPATIENT_CLINIC_OR_DEPARTMENT_OTHER): Payer: Medicare Other

## 2022-10-30 ENCOUNTER — Encounter (HOSPITAL_BASED_OUTPATIENT_CLINIC_OR_DEPARTMENT_OTHER): Payer: Self-pay | Admitting: Urology

## 2022-10-30 DIAGNOSIS — Z8673 Personal history of transient ischemic attack (TIA), and cerebral infarction without residual deficits: Secondary | ICD-10-CM | POA: Insufficient documentation

## 2022-10-30 DIAGNOSIS — R002 Palpitations: Secondary | ICD-10-CM | POA: Diagnosis not present

## 2022-10-30 DIAGNOSIS — R42 Dizziness and giddiness: Secondary | ICD-10-CM | POA: Diagnosis not present

## 2022-10-30 DIAGNOSIS — Z79899 Other long term (current) drug therapy: Secondary | ICD-10-CM | POA: Diagnosis not present

## 2022-10-30 DIAGNOSIS — I10 Essential (primary) hypertension: Secondary | ICD-10-CM | POA: Insufficient documentation

## 2022-10-30 LAB — CBC
HCT: 43.6 % (ref 36.0–46.0)
Hemoglobin: 14.7 g/dL (ref 12.0–15.0)
MCH: 30.2 pg (ref 26.0–34.0)
MCHC: 33.7 g/dL (ref 30.0–36.0)
MCV: 89.5 fL (ref 80.0–100.0)
Platelets: 282 10*3/uL (ref 150–400)
RBC: 4.87 MIL/uL (ref 3.87–5.11)
RDW: 14.4 % (ref 11.5–15.5)
WBC: 11.4 10*3/uL — ABNORMAL HIGH (ref 4.0–10.5)
nRBC: 0 % (ref 0.0–0.2)

## 2022-10-30 LAB — BASIC METABOLIC PANEL
Anion gap: 10 (ref 5–15)
BUN: 20 mg/dL (ref 8–23)
CO2: 24 mmol/L (ref 22–32)
Calcium: 9.5 mg/dL (ref 8.9–10.3)
Chloride: 105 mmol/L (ref 98–111)
Creatinine, Ser: 0.56 mg/dL (ref 0.44–1.00)
GFR, Estimated: >60 mL/min (ref >60)
Glucose, Bld: 115 mg/dL — ABNORMAL HIGH (ref 70–99)
Potassium: 3.8 mmol/L (ref 3.5–5.1)
Sodium: 139 mmol/L (ref 135–145)

## 2022-10-30 LAB — TROPONIN I (HIGH SENSITIVITY): Troponin I (High Sensitivity): <2 ng/L (ref <18)

## 2022-10-30 MED ORDER — MECLIZINE HCL 25 MG PO TABS
50.0000 mg | ORAL_TABLET | Freq: Once | ORAL | Status: AC
  Administered 2022-10-30: 50 mg via ORAL
  Filled 2022-10-30: qty 2

## 2022-10-30 MED ORDER — MECLIZINE HCL 25 MG PO TABS: 25.0000 mg | ORAL_TABLET | Freq: Three times a day (TID) | ORAL | 0 refills | Status: AC | PRN

## 2022-10-30 NOTE — ED Triage Notes (Signed)
Pt reports "episode" at 1220 of dizziness "room tilting and lightheaded"  states felt like heart was beating fast  Pt denies pain or SOB at this time States still feeling dizzy, worse with movement

## 2022-10-30 NOTE — ED Provider Notes (Signed)
[facility] EMERGENCY DEPARTMENT Provider Note   CSN: [id number] Arrival date & time: 10/30/22  1255     History  Chief Complaint  Patient presents with   Palpitations    Andrea [name] is a 67 y.o. female presenting to the ED with palpitations and lightheadedness.  Patient reports that her symptoms began abruptly around 12:00 today, when she was in her kitchen after grocery shopping.  She says she turned around and abruptly began to feel lightheaded and vertiginous, like her balance was off.  She felt some palpitations in her chest at that time.  She says she had a hard time even staring at her phone screen or computer screen because it was moving beneath her and she felt dizzy.  She came into the ED at the time.  She says she has never had a history of vertigo, stroke, TIA.  She does not smoke.  She takes metformin for "weight loss".  She does have blood pressure is usually well-controlled on medications.  She is also on cholesterol medicine.  Currently at rest her symptoms are minimal but still present in terms of lightheadedness and vertigo.  The symptoms are worsened with any type of head movement or with standing.  She denies any recent fevers, chills.  She denies history of A-fib or cardiac rhythm irregularities or coronary disease.  HPI     Home Medications Prior to Admission medications   Medication Sig Start Date End Date Taking? Authorizing Provider  meclizine (ANTIVERT) 25 MG tablet Take 1 tablet (25 mg total) by mouth 3 (three) times daily as needed for up to 30 doses for dizziness. 10/30/22  Yes Wyvonnia Dusky, MD  azelastine (ASTELIN) 0.1 % nasal spray Place 1 spray into both nostrils 2 (two) times daily. Use in each nostril as directed Patient not taking: Reported on 06/16/2019 10/08/18   Shella Maxim, NP  baclofen (LIORESAL) 10 MG tablet Take 1 tablet (10 mg total) by mouth every 8 (eight) hours as needed for muscle spasms. Patient not taking: Reported on  06/16/2019 12/22/17   Benjamine Mola, FNP  COVID-19 mRNA bivalent vaccine, Pfizer, (PFIZER COVID-19 VAC BIVALENT) injection Inject into the muscle. 08/29/21   Carlyle Basques, MD  COVID-19 mRNA Vac-TriS, Pfizer, SUSP injection Inject into the muscle. 05/07/21   Carlyle Basques, MD  cyclobenzaprine (FLEXERIL) 10 MG tablet Take 1 tablet (10 mg total) by mouth 3 (three) times daily as needed for muscle spasms. Patient not taking: Reported on 06/16/2019 04/05/19   Waldon Merl, PA-C  etodolac (LODINE) 300 MG capsule Take 1 capsule (300 mg total) by mouth 2 (two) times daily. Patient not taking: Reported on 06/16/2019 12/22/17   Benjamine Mola, FNP  metFORMIN (GLUCOPHAGE) 500 MG tablet TAKE 1 TABLET BY MOUTH DAILY WITH FOOD FOR 1 WEEK THEN INCREASE TO 1 TABLET 2 TIMES DAILY 11/17/20 11/17/21  Kathyrn Lass, MD  naproxen (NAPROSYN) 500 MG tablet Take 1 tablet (500 mg total) by mouth 2 (two) times daily with a meal. Patient not taking: Reported on 06/16/2019 04/05/19   Waldon Merl, PA-C  RSV vaccine recomb adjuvanted (AREXVY) 120 MCG/0.5ML injection Inject into the muscle. 08/16/22   Carlyle Basques, MD  valsartan-hydrochlorothiazide (DIOVAN-HCT) 80-12.5 MG per tablet Take 1 tablet by mouth daily.    [provider]  valsartan-hydrochlorothiazide (DIOVAN-HCT) 80-12.5 MG tablet TAKE 1 TABLET BY MOUTH ONCE DAILY 03/27/20 06/25/21  Redmon, Barth Kirks, PA  Vitamin D, Ergocalciferol, (DRISDOL) 50000 units CAPS capsule Take 1 capsule (  50,000 Units total) by mouth every 7 (seven) days. Patient not taking: Reported on 06/16/2019 10/02/17   Waldon Merl, PA-C      Allergies    Pork-derived products and Budesonide    Review of Systems   Review of Systems  Physical Exam Updated Vital Signs BP (!) 140/66   Pulse (!) 59   Temp 98 F (36.7 C)   Resp 19   Ht '5\' 3"'$  (1.6 m)   Wt 90.7 kg   LMP 05/30/2011   SpO2 97%   BMI 35.43 kg/m  Physical Exam Constitutional:      General: She is not in acute  distress. HENT:     Head: Normocephalic and atraumatic.  Eyes:     Conjunctiva/sclera: Conjunctivae normal.     Pupils: Pupils are equal, round, and reactive to light.  Cardiovascular:     Rate and Rhythm: Normal rate and regular rhythm.  Pulmonary:     Effort: Pulmonary effort is normal. No respiratory distress.  Abdominal:     General: There is no distension.     Tenderness: There is no abdominal tenderness.  Skin:    General: Skin is warm and dry.  Neurological:     General: No focal deficit present.     Mental Status: She is alert and oriented to person, place, and time. Mental status is at baseline.     Cranial Nerves: No cranial nerve deficit.     Sensory: No sensory deficit.     Motor: No weakness.     Coordination: Coordination normal.     Gait: Gait normal.  Psychiatric:        Mood and Affect: Mood normal.        Behavior: Behavior normal.     ED Results / Procedures / Treatments   Labs (all labs ordered are listed, but only abnormal results are displayed) Labs Reviewed  BASIC METABOLIC PANEL - Abnormal; Notable for the following components:      Result Value   Glucose, Bld 115 (*)    All other components within normal limits  CBC - Abnormal; Notable for the following components:   WBC 11.4 (*)    All other components within normal limits  TROPONIN I (HIGH SENSITIVITY)    EKG EKG Interpretation  Date/Time:  Wednesday October 30 2022 13:06:15 EST Ventricular Rate:  73 PR Interval:  149 QRS Duration: 89 QT Interval:  391 QTC Calculation: 431 R Axis:   27 Text Interpretation: Sinus rhythm Nonspecific q wave leads III, no STEMI Confirmed by Octaviano Glow [phone]) on 10/30/2022 4:25:30 PM  Radiology MR BRAIN WO CONTRAST  Result Date: 10/30/2022 CLINICAL DATA:  TIA. Vertigo. Episode of dizziness with room tilting and lightheadedness. Patient states she felt like heart is beating fast. EXAM: MRI HEAD WITHOUT CONTRAST TECHNIQUE: Multiplanar, multiecho  pulse sequences of the brain and surrounding structures were obtained without intravenous contrast. COMPARISON:  None Available. FINDINGS: Brain: No acute infarct, hemorrhage, or mass lesion is present. No significant white matter lesions are present. Deep brain nuclei are within normal limits. The ventricles are of normal size. No significant extraaxial fluid collection is present. The internal auditory canals are within normal limits. The brainstem and cerebellum are within normal limits. Midline structures are within normal limits. Vascular: Flow is present in the major intracranial arteries. Skull and upper cervical spine: The craniocervical junction is normal. Upper cervical spine is within normal limits. Marrow signal is unremarkable. Sinuses/Orbits: The paranasal sinuses and mastoid air cells  are clear. The globes and orbits are within normal limits. IMPRESSION: Negative MRI of the brain. No acute or focal lesion to explain the patient's symptoms. Electronically Signed   By: San Morelle M.D.   On: 10/30/2022 18:58    Procedures Procedures    Medications Ordered in ED Medications  meclizine (ANTIVERT) tablet 50 mg (50 mg Oral Given 10/30/22 1651)    ED Course/ Medical Decision Making/ A&P Clinical Course as of 10/30/22 2041  Wed Oct 30, 2022  1920 MRI is unremarkable patient is feeling significantly better after meclizine.  Stable ambulating.  I suspect this likely peripheral vertigo, she can follow-up with her PCP. [MT]    Clinical Course User Index [MT] Nadalee Neiswender, Carola Rhine, MD                           Medical Decision Making Amount and/or Complexity of Data Reviewed Labs: ordered. Radiology: ordered.   This patient presents to the ED with concern for lightheadedness, vertigo, palpitations. This involves an extensive number of treatment options, and is a complaint that carries with it a high risk of complications and morbidity.  The differential diagnosis includes arrhythmia  versus TIA versus CVA versus peripheral vertigo versus anemia versus electrolyte derangement versus other  Overall her initial presentation is most consistent with sudden onset peripheral vertigo, positional vertigo.  She continues to have the sensation of dizziness despite being in normal sinus rhythm in the ED, which lowers my suspicion for paroxysmal A-fib at home.  She does not have any risk factors for pulmonary embolism, has no tachycardia or hypoxia at this time.  Co-morbidities that complicate the patient evaluation: Patient does have risk factors for coronary disease and cardiovascular disease which would include hypertension, high cholesterol.  I ordered and personally interpreted labs.  The pertinent results include: Troponin undetectable; WBC 11.4; hgb wnl; BMP unremarkable  I ordered imaging studies including MRI of the brain I independently visualized and interpreted imaging which showed no acute abnormalities I agree with the radiologist interpretation  The patient was maintained on a cardiac monitor.  I personally viewed and interpreted the cardiac monitored which showed an underlying rhythm of: Sinus rhythm with occasional PACs and PVCs.  Per my interpretation the patient's ECG shows sinus rhythm no acute ischemic findings.  I ordered medication including closing for suspected peripheral vertigo  I have reviewed the patients home medicines and have made adjustments as needed  Test Considered: Low suspicion for acute PE, meningitis, stroke  After the interventions noted above, I reevaluated the patient and found that they have: improved   Dispostion:  After consideration of the diagnostic results and the patients response to treatment, I feel that the patent would benefit from outpatient follow-up.         Final Clinical Impression(s) / ED Diagnoses Final diagnoses:  Vertigo  Palpitations    Rx / DC Orders ED Discharge Orders          Ordered    meclizine  (ANTIVERT) 25 MG tablet  3 times daily PRN        10/30/22 1936              Wyvonnia Dusky, MD 10/30/22 2041

## 2022-10-30 NOTE — ED Notes (Signed)
D/c paperwork reviewed with pt, including prescriptions and follow up care.  No questions or concerns voiced at time of d/c. . Pt verbalized understanding, Ambulatory with family to ED exit, NAD.   

## 2022-10-30 NOTE — ED Notes (Signed)
Pt reports improvement after medication, awaiting MRI result, family at bedside

## 2022-11-05 ENCOUNTER — Other Ambulatory Visit (HOSPITAL_BASED_OUTPATIENT_CLINIC_OR_DEPARTMENT_OTHER): Payer: Self-pay | Admitting: Family Medicine

## 2022-11-05 DIAGNOSIS — Z1231 Encounter for screening mammogram for malignant neoplasm of breast: Secondary | ICD-10-CM

## 2022-11-06 DIAGNOSIS — E65 Localized adiposity: Secondary | ICD-10-CM | POA: Diagnosis not present

## 2022-11-06 DIAGNOSIS — I10 Essential (primary) hypertension: Secondary | ICD-10-CM | POA: Diagnosis not present

## 2022-11-28 DIAGNOSIS — I10 Essential (primary) hypertension: Secondary | ICD-10-CM | POA: Diagnosis not present

## 2022-11-28 DIAGNOSIS — R7303 Prediabetes: Secondary | ICD-10-CM | POA: Diagnosis not present

## 2022-12-09 ENCOUNTER — Ambulatory Visit (HOSPITAL_BASED_OUTPATIENT_CLINIC_OR_DEPARTMENT_OTHER)
Admission: RE | Admit: 2022-12-09 | Discharge: 2022-12-09 | Disposition: A | Discharge status: Home / Self Care | Payer: Medicare Other | Source: Ambulatory Visit | Attending: Family Medicine | Admitting: Family Medicine

## 2022-12-09 ENCOUNTER — Encounter (HOSPITAL_BASED_OUTPATIENT_CLINIC_OR_DEPARTMENT_OTHER): Payer: Self-pay

## 2022-12-09 DIAGNOSIS — Z1231 Encounter for screening mammogram for malignant neoplasm of breast: Secondary | ICD-10-CM | POA: Insufficient documentation

## 2023-02-24 DIAGNOSIS — R7303 Prediabetes: Secondary | ICD-10-CM | POA: Diagnosis not present

## 2023-02-24 DIAGNOSIS — E7800 Pure hypercholesterolemia, unspecified: Secondary | ICD-10-CM | POA: Diagnosis not present

## 2023-02-24 DIAGNOSIS — I10 Essential (primary) hypertension: Secondary | ICD-10-CM | POA: Diagnosis not present

## 2023-04-14 DIAGNOSIS — M25551 Pain in right hip: Secondary | ICD-10-CM | POA: Diagnosis not present

## 2023-04-14 DIAGNOSIS — M545 Low back pain, unspecified: Secondary | ICD-10-CM | POA: Diagnosis not present

## 2023-06-22 ENCOUNTER — Emergency Department (HOSPITAL_BASED_OUTPATIENT_CLINIC_OR_DEPARTMENT_OTHER): Payer: Medicare Other

## 2023-06-22 ENCOUNTER — Encounter (HOSPITAL_BASED_OUTPATIENT_CLINIC_OR_DEPARTMENT_OTHER): Payer: Self-pay

## 2023-06-22 ENCOUNTER — Emergency Department (HOSPITAL_BASED_OUTPATIENT_CLINIC_OR_DEPARTMENT_OTHER)
Admission: EM | Admit: 2023-06-22 | Discharge: 2023-06-22 | Disposition: A | Discharge status: Home / Self Care | Payer: Medicare Other | Attending: Emergency Medicine | Admitting: Emergency Medicine

## 2023-06-22 ENCOUNTER — Other Ambulatory Visit: Payer: Self-pay

## 2023-06-22 DIAGNOSIS — M79651 Pain in right thigh: Secondary | ICD-10-CM | POA: Diagnosis not present

## 2023-06-22 DIAGNOSIS — M25551 Pain in right hip: Secondary | ICD-10-CM | POA: Diagnosis not present

## 2023-06-22 DIAGNOSIS — Z79899 Other long term (current) drug therapy: Secondary | ICD-10-CM | POA: Insufficient documentation

## 2023-06-22 DIAGNOSIS — I10 Essential (primary) hypertension: Secondary | ICD-10-CM | POA: Diagnosis not present

## 2023-06-22 MED ORDER — OXYCODONE HCL 5 MG PO TABS: 5.0000 mg | ORAL_TABLET | Freq: Four times a day (QID) | ORAL | 0 refills | Status: AC | PRN

## 2023-06-22 MED ORDER — OXYCODONE-ACETAMINOPHEN 5-325 MG PO TABS
1.0000 | ORAL_TABLET | Freq: Once | ORAL | Status: AC
  Administered 2023-06-22: 1 via ORAL
  Filled 2023-06-22: qty 1

## 2023-06-22 NOTE — ED Triage Notes (Signed)
The patient having hip for two two days. No injury.

## 2023-06-22 NOTE — ED Provider Notes (Signed)
[facility] Provider Note  CSN: [id number] Arrival date & time: 06/22/23 1054  Chief Complaint(s) Hip Pain  HPI Andrea [name] is a 67 y.o. female with history of hypertension, hyperlipidemia presenting to the emergency department with right leg pain.  Patient reports pain primarily in the right posterior thigh and hip.  Pain is worse with movement.  No trauma.  No fevers or chills.  No rashes.  Took some ibuprofen and Flexeril without significant improvement.   Past Medical History Past Medical History:  Diagnosis Date   Gallbladder problem    GERD (gastroesophageal reflux disease)    Hyperlipidemia    Hypertension    Joint pain    Leg edema    Multiple food allergies    Obesity    Patient Active Problem List   Diagnosis Date Noted   Vitamin D deficiency 07/01/2017   Insulin resistance 07/01/2017   Class 2 obesity with serious comorbidity and body mass index (BMI) of 36.0 to 36.9 in adult 07/01/2017   Other fatigue 06/17/2017   Shortness of breath on exertion 06/17/2017   Essential hypertension 06/17/2017   Class 2 obesity with serious comorbidity and body mass index (BMI) of 37.0 to 37.9 in adult 06/17/2017   Angioedema 01/16/2016   Allergic rhinitis 01/16/2016   Chronic urticaria 12/19/2015   Home Medication(s) Prior to Admission medications   Medication Sig Start Date End Date Taking? Authorizing Provider  oxyCODONE (ROXICODONE) 5 MG immediate release tablet Take 1 tablet (5 mg total) by mouth every 6 (six) hours as needed for breakthrough pain. 06/22/23  Yes Lonell Grandchild, MD  azelastine (ASTELIN) 0.1 % nasal spray Place 1 spray into both nostrils 2 (two) times daily. Use in each nostril as directed Patient not taking: Reported on 06/16/2019 10/08/18   Zachery Dauer, NP  baclofen (LIORESAL) 10 MG tablet Take 1 tablet (10 mg total) by mouth every 8 (eight) hours as needed for muscle spasms. Patient not taking: Reported on  06/16/2019 12/22/17   Beau Fanny, FNP  COVID-19 mRNA bivalent vaccine, Pfizer, (PFIZER COVID-19 VAC BIVALENT) injection Inject into the muscle. 08/29/21   Judyann Munson, MD  COVID-19 mRNA Vac-TriS, Pfizer, SUSP injection Inject into the muscle. 05/07/21   Judyann Munson, MD  cyclobenzaprine (FLEXERIL) 10 MG tablet Take 1 tablet (10 mg total) by mouth 3 (three) times daily as needed for muscle spasms. Patient not taking: Reported on 06/16/2019 04/05/19   Demetrio Lapping, PA-C  etodolac (LODINE) 300 MG capsule Take 1 capsule (300 mg total) by mouth 2 (two) times daily. Patient not taking: Reported on 06/16/2019 12/22/17   Beau Fanny, FNP  meclizine (ANTIVERT) 25 MG tablet Take 1 tablet (25 mg total) by mouth 3 (three) times daily as needed for up to 30 doses for dizziness. 10/30/22   Terald Sleeper, MD  metFORMIN (GLUCOPHAGE) 500 MG tablet TAKE 1 TABLET BY MOUTH DAILY WITH FOOD FOR 1 WEEK THEN INCREASE TO 1 TABLET 2 TIMES DAILY 11/17/20 11/17/21  Sigmund Hazel, MD  naproxen (NAPROSYN) 500 MG tablet Take 1 tablet (500 mg total) by mouth 2 (two) times daily with a meal. Patient not taking: Reported on 06/16/2019 04/05/19   Demetrio Lapping, PA-C  RSV vaccine recomb adjuvanted (AREXVY) 120 MCG/0.5ML injection Inject into the muscle. 08/16/22   Judyann Munson, MD  valsartan-hydrochlorothiazide (DIOVAN-HCT) 80-12.5 MG per tablet Take 1 tablet by mouth daily.    [provider]  valsartan-hydrochlorothiazide (DIOVAN-HCT) 80-12.5  MG tablet TAKE 1 TABLET BY MOUTH ONCE DAILY 03/27/20 06/25/21  Redmon, Altamease Oiler, PA  Vitamin D, Ergocalciferol, (DRISDOL) 50000 units CAPS capsule Take 1 capsule (50,000 Units total) by mouth every 7 (seven) days. Patient not taking: Reported on 06/16/2019 10/02/17   Gayleen Orem                                                                                                                                    Past Surgical History Past Surgical History:  Procedure  Laterality Date   APPENDECTOMY     CESAREAN SECTION     HERNIA REPAIR     LAPAROSCOPIC CHOLECYSTECTOMY     POSTERIOR TIBIAL TENDON REPAIR     TONSILECTOMY, ADENOIDECTOMY, BILATERAL MYRINGOTOMY AND TUBES  1983   Family History Family History  Problem Relation Age of Onset   Asthma Son    Stroke Mother    Thyroid disease Mother    Liver disease Father    Alcoholism Father    Breast cancer Neg Hx     Social History Social History   Tobacco Use   Smoking status: Never   Smokeless tobacco: Never  Substance Use Topics   Alcohol use: No    Alcohol/week: 0.0 standard drinks of alcohol   Drug use: No   Allergies Pork-derived products and Budesonide  Review of Systems Review of Systems  All other systems reviewed and are negative.   Physical Exam Vital Signs  I have reviewed the triage vital signs BP (!) 163/90   Pulse 86   Temp 98.4 F (36.9 C) (Temporal)   Resp 18   Ht 5\' 3"  (1.6 m)   Wt 90 kg   LMP 05/30/2011   SpO2 94%   BMI 35.15 kg/m  Physical Exam Vitals and nursing note reviewed.  Constitutional:      Appearance: Normal appearance.  HENT:     Head: Normocephalic and atraumatic.     Mouth/Throat:     Mouth: Mucous membranes are moist.  Eyes:     Conjunctiva/sclera: Conjunctivae normal.  Cardiovascular:     Rate and Rhythm: Normal rate.     Pulses:          Dorsalis pedis pulses are 2+ on the right side and 2+ on the left side.  Pulmonary:     Effort: Pulmonary effort is normal. No respiratory distress.  Abdominal:     General: Abdomen is flat.  Musculoskeletal:        General: No deformity.     Comments: Painful active range of motion of the right hip with painless passive range of motion of the right hip.  Tenderness over the posterior thigh.  No tenderness over the hip area.  No tenderness surrounding the knee.  No leg swelling.  Skin:    General: Skin is warm and dry.     Capillary Refill: Capillary refill takes less than 2 seconds.  Neurological:     General: No focal deficit present.     Mental Status: She is alert. Mental status is at baseline.  Psychiatric:        Mood and Affect: Mood normal.        Behavior: Behavior normal.     ED Results and Treatments Labs (all labs ordered are listed, but only abnormal results are displayed) Labs Reviewed - No data to display                                                                                                                        Radiology DG Hip Unilat  With Pelvis 2-3 Views Right  Result Date: 06/22/2023 CLINICAL DATA:  Injury.  Pain in hip for 2 days. EXAM: DG HIP (WITH OR WITHOUT PELVIS) 2-3V RIGHT COMPARISON:  MRI 10/27/2015 FINDINGS: Both hips are located. There is no sign of acute fracture or dislocation. No signs of pelvic fracture or diastasis. Hernia mesh fasteners are identified overlying the right lower quadrant of the abdomen. IMPRESSION: 1. No acute findings. 2. Right lower quadrant hernia mesh. Electronically Signed   By: Signa Kell M.D.   On: 06/22/2023 12:30    Pertinent labs & imaging results that were available during my care of the patient were reviewed by me and considered in my medical decision making (see MDM for details).  Medications Ordered in ED Medications  oxyCODONE-acetaminophen (PERCOCET/ROXICET) 5-325 MG per tablet 1 tablet (1 tablet Oral Given 06/22/23 1207)                                                                                                                                     Procedures Procedures  (including critical care time)  Medical Decision Making / ED Course   MDM:  67 year old presenting with right leg pain.  Exam with some tenderness over the right posterior thigh, no rashes, erythema, induration or fluctuance.  No signs of infection on exam.  Seems most likely musculoskeletal, possibly hamstring.  Doubt fracture primary hip pathology without falls or trauma but will obtain x-ray.  No leg  swelling to suggest DVT.  Will try pain medication and reassess after x-ray.  Clinical Course as of 06/22/23 1318  Sun Jun 22, 2023  1317 X-ray negative.  Patient able to ambulate after pain control.  Suspect musculoskeletal strain.  She already has an orthopedic doctor so advised follow-up with  them if needed.  Patient requests oxycodone, checked PDMP has not received any prescriptions.  She has already been trying Tylenol and Motrin at home for this so we will prescribe a short course.  Advised not to take alcohol or drive while taking this medicine.  Advised to ideally avoid or be cautious when taking this with muscle relaxer as it may make her very drowsy. Will discharge patient to home. All questions answered. Patient comfortable with plan of discharge. Return precautions discussed with patient and specified on the after visit summary.  [WS]    Clinical Course User Index [WS] Lonell Grandchild, MD     Additional history obtained: -Additional history obtained from spouse   Imaging Studies ordered: I ordered imaging studies including XR hip On my interpretation imaging demonstrates no acute process I independently visualized and interpreted imaging. I agree with the radiologist interpretation   Medicines ordered and prescription drug management: Meds ordered this encounter  Medications   oxyCODONE-acetaminophen (PERCOCET/ROXICET) 5-325 MG per tablet 1 tablet   oxyCODONE (ROXICODONE) 5 MG immediate release tablet    Sig: Take 1 tablet (5 mg total) by mouth every 6 (six) hours as needed for breakthrough pain.    Dispense:  5 tablet    Refill:  0    -I have reviewed the patients home medicines and have made adjustments as needed  Social Determinants of Health:  Diagnosis or treatment significantly limited by social determinants of health: obesity   Reevaluation: After the interventions noted above, I reevaluated the patient and found that their symptoms have improved  Co  morbidities that complicate the patient evaluation  Past Medical History:  Diagnosis Date   Gallbladder problem    GERD (gastroesophageal reflux disease)    Hyperlipidemia    Hypertension    Joint pain    Leg edema    Multiple food allergies    Obesity       Dispostion: Disposition decision including need for hospitalization was considered, and patient discharged from emergency department.    Final Clinical Impression(s) / ED Diagnoses Final diagnoses:  Thigh pain, musculoskeletal, right     This chart was dictated using voice recognition software.  Despite best efforts to proofread,  errors can occur which can change the documentation meaning.    Lonell Grandchild, MD 06/22/23 1318

## 2023-06-22 NOTE — Discharge Instructions (Signed)
We evaluated you for your hip and thigh pain.  I believe the most likely cause of your symptoms is a strain to your hamstring muscle.  Your x-ray was negative for any fracture or bone injury and we did not see any signs of an infection or blood clot on your physical exam.  Please take Tylenol and Motrin for your symptoms at home.  You can take 1000 mg of Tylenol every 6 hours and 600 mg of ibuprofen every 6 hours as needed for your symptoms.  You can take these medicines together as needed, either at the same time, or alternating every 3 hours.  I prescribed you a short course of oxycodone for pain not controlled by Tylenol and Motrin.  Please only take this if necessary and do not drive or drink alcohol taking this medication.  You can also try your muscle relaxer that you have at home but I would be careful when combining this with oxycodone as it may make you very drowsy.  If your symptoms do not improve, please follow-up with your orthopedic doctor.  Please return if you develop any new or worsening symptoms such as severe pain, redness or swelling to your leg, fevers or chills, inability to walk, or any other concerning symptoms.

## 2023-06-22 NOTE — ED Notes (Signed)
Ambulated in hallway.

## 2023-07-02 DIAGNOSIS — H938X2 Other specified disorders of left ear: Secondary | ICD-10-CM | POA: Diagnosis not present

## 2023-07-02 DIAGNOSIS — L821 Other seborrheic keratosis: Secondary | ICD-10-CM | POA: Diagnosis not present

## 2023-07-02 DIAGNOSIS — H9042 Sensorineural hearing loss, unilateral, left ear, with unrestricted hearing on the contralateral side: Secondary | ICD-10-CM | POA: Diagnosis not present

## 2023-07-02 DIAGNOSIS — L570 Actinic keratosis: Secondary | ICD-10-CM | POA: Diagnosis not present

## 2023-07-02 DIAGNOSIS — L814 Other melanin hyperpigmentation: Secondary | ICD-10-CM | POA: Diagnosis not present

## 2023-07-02 DIAGNOSIS — Z85828 Personal history of other malignant neoplasm of skin: Secondary | ICD-10-CM | POA: Diagnosis not present

## 2023-07-07 DIAGNOSIS — H9122 Sudden idiopathic hearing loss, left ear: Secondary | ICD-10-CM | POA: Diagnosis not present

## 2023-07-07 DIAGNOSIS — H938X2 Other specified disorders of left ear: Secondary | ICD-10-CM | POA: Diagnosis not present

## 2023-07-07 DIAGNOSIS — H9042 Sensorineural hearing loss, unilateral, left ear, with unrestricted hearing on the contralateral side: Secondary | ICD-10-CM | POA: Diagnosis not present

## 2023-07-14 DIAGNOSIS — H938X2 Other specified disorders of left ear: Secondary | ICD-10-CM | POA: Diagnosis not present

## 2023-07-14 DIAGNOSIS — H9042 Sensorineural hearing loss, unilateral, left ear, with unrestricted hearing on the contralateral side: Secondary | ICD-10-CM | POA: Diagnosis not present

## 2023-08-13 DIAGNOSIS — H9042 Sensorineural hearing loss, unilateral, left ear, with unrestricted hearing on the contralateral side: Secondary | ICD-10-CM | POA: Diagnosis not present

## 2023-09-22 DIAGNOSIS — H905 Unspecified sensorineural hearing loss: Secondary | ICD-10-CM | POA: Diagnosis not present

## 2023-09-22 DIAGNOSIS — Z0000 Encounter for general adult medical examination without abnormal findings: Secondary | ICD-10-CM | POA: Diagnosis not present

## 2023-09-23 DIAGNOSIS — I10 Essential (primary) hypertension: Secondary | ICD-10-CM | POA: Diagnosis not present

## 2023-09-23 DIAGNOSIS — E119 Type 2 diabetes mellitus without complications: Secondary | ICD-10-CM | POA: Diagnosis not present

## 2023-09-23 DIAGNOSIS — E7800 Pure hypercholesterolemia, unspecified: Secondary | ICD-10-CM | POA: Diagnosis not present

## 2023-09-24 DIAGNOSIS — H04123 Dry eye syndrome of bilateral lacrimal glands: Secondary | ICD-10-CM | POA: Diagnosis not present

## 2023-09-24 DIAGNOSIS — H43813 Vitreous degeneration, bilateral: Secondary | ICD-10-CM | POA: Diagnosis not present

## 2023-09-24 DIAGNOSIS — H00015 Hordeolum externum left lower eyelid: Secondary | ICD-10-CM | POA: Diagnosis not present

## 2023-09-24 DIAGNOSIS — H524 Presbyopia: Secondary | ICD-10-CM | POA: Diagnosis not present

## 2023-09-24 DIAGNOSIS — H52223 Regular astigmatism, bilateral: Secondary | ICD-10-CM | POA: Diagnosis not present

## 2023-09-24 DIAGNOSIS — H25812 Combined forms of age-related cataract, left eye: Secondary | ICD-10-CM | POA: Diagnosis not present

## 2023-09-24 DIAGNOSIS — H5203 Hypermetropia, bilateral: Secondary | ICD-10-CM | POA: Diagnosis not present

## 2023-10-31 ENCOUNTER — Other Ambulatory Visit (HOSPITAL_BASED_OUTPATIENT_CLINIC_OR_DEPARTMENT_OTHER): Payer: Self-pay | Admitting: Family Medicine

## 2023-10-31 DIAGNOSIS — Z1231 Encounter for screening mammogram for malignant neoplasm of breast: Secondary | ICD-10-CM

## 2023-12-15 ENCOUNTER — Encounter (HOSPITAL_BASED_OUTPATIENT_CLINIC_OR_DEPARTMENT_OTHER): Payer: Self-pay

## 2023-12-15 ENCOUNTER — Ambulatory Visit (HOSPITAL_BASED_OUTPATIENT_CLINIC_OR_DEPARTMENT_OTHER)
Admission: RE | Admit: 2023-12-15 | Discharge: 2023-12-15 | Disposition: A | Discharge status: Home / Self Care | Payer: Medicare Other | Source: Ambulatory Visit | Attending: Family Medicine | Admitting: Family Medicine

## 2023-12-15 DIAGNOSIS — Z1231 Encounter for screening mammogram for malignant neoplasm of breast: Secondary | ICD-10-CM | POA: Insufficient documentation

## 2024-01-13 DIAGNOSIS — L570 Actinic keratosis: Secondary | ICD-10-CM | POA: Diagnosis not present

## 2024-02-04 DIAGNOSIS — M25561 Pain in right knee: Secondary | ICD-10-CM | POA: Diagnosis not present

## 2024-02-04 DIAGNOSIS — M25562 Pain in left knee: Secondary | ICD-10-CM | POA: Diagnosis not present

## 2024-03-04 DIAGNOSIS — I10 Essential (primary) hypertension: Secondary | ICD-10-CM | POA: Diagnosis not present

## 2024-03-04 DIAGNOSIS — Z79899 Other long term (current) drug therapy: Secondary | ICD-10-CM | POA: Diagnosis not present

## 2024-03-04 DIAGNOSIS — R7303 Prediabetes: Secondary | ICD-10-CM | POA: Diagnosis not present

## 2024-03-04 DIAGNOSIS — E785 Hyperlipidemia, unspecified: Secondary | ICD-10-CM | POA: Diagnosis not present

## 2024-03-04 DIAGNOSIS — E559 Vitamin D deficiency, unspecified: Secondary | ICD-10-CM | POA: Diagnosis not present

## 2024-03-18 DIAGNOSIS — R7303 Prediabetes: Secondary | ICD-10-CM | POA: Diagnosis not present

## 2024-03-18 DIAGNOSIS — I10 Essential (primary) hypertension: Secondary | ICD-10-CM | POA: Diagnosis not present

## 2024-03-18 DIAGNOSIS — E785 Hyperlipidemia, unspecified: Secondary | ICD-10-CM | POA: Diagnosis not present

## 2024-03-18 DIAGNOSIS — E538 Deficiency of other specified B group vitamins: Secondary | ICD-10-CM | POA: Diagnosis not present

## 2024-03-18 DIAGNOSIS — E559 Vitamin D deficiency, unspecified: Secondary | ICD-10-CM | POA: Diagnosis not present

## 2024-03-22 DIAGNOSIS — E538 Deficiency of other specified B group vitamins: Secondary | ICD-10-CM | POA: Diagnosis not present

## 2024-03-22 DIAGNOSIS — H9122 Sudden idiopathic hearing loss, left ear: Secondary | ICD-10-CM | POA: Diagnosis not present

## 2024-03-22 DIAGNOSIS — E559 Vitamin D deficiency, unspecified: Secondary | ICD-10-CM | POA: Diagnosis not present

## 2024-03-22 DIAGNOSIS — I10 Essential (primary) hypertension: Secondary | ICD-10-CM | POA: Diagnosis not present

## 2024-03-22 DIAGNOSIS — R7303 Prediabetes: Secondary | ICD-10-CM | POA: Diagnosis not present

## 2024-03-22 DIAGNOSIS — E7849 Other hyperlipidemia: Secondary | ICD-10-CM | POA: Diagnosis not present

## 2024-04-05 DIAGNOSIS — I10 Essential (primary) hypertension: Secondary | ICD-10-CM | POA: Diagnosis not present

## 2024-04-05 DIAGNOSIS — E785 Hyperlipidemia, unspecified: Secondary | ICD-10-CM | POA: Diagnosis not present

## 2024-04-05 DIAGNOSIS — E65 Localized adiposity: Secondary | ICD-10-CM | POA: Diagnosis not present

## 2024-04-05 DIAGNOSIS — R7303 Prediabetes: Secondary | ICD-10-CM | POA: Diagnosis not present

## 2024-04-20 DIAGNOSIS — R7303 Prediabetes: Secondary | ICD-10-CM | POA: Diagnosis not present

## 2024-04-20 DIAGNOSIS — E785 Hyperlipidemia, unspecified: Secondary | ICD-10-CM | POA: Diagnosis not present

## 2024-04-20 DIAGNOSIS — E162 Hypoglycemia, unspecified: Secondary | ICD-10-CM | POA: Diagnosis not present

## 2024-04-20 DIAGNOSIS — E65 Localized adiposity: Secondary | ICD-10-CM | POA: Diagnosis not present

## 2024-04-20 DIAGNOSIS — I10 Essential (primary) hypertension: Secondary | ICD-10-CM | POA: Diagnosis not present

## 2024-05-12 DIAGNOSIS — M25561 Pain in right knee: Secondary | ICD-10-CM | POA: Diagnosis not present

## 2024-05-12 DIAGNOSIS — M25562 Pain in left knee: Secondary | ICD-10-CM | POA: Diagnosis not present

## 2024-05-13 DIAGNOSIS — R7303 Prediabetes: Secondary | ICD-10-CM | POA: Diagnosis not present

## 2024-05-13 DIAGNOSIS — E162 Hypoglycemia, unspecified: Secondary | ICD-10-CM | POA: Diagnosis not present

## 2024-05-13 DIAGNOSIS — I10 Essential (primary) hypertension: Secondary | ICD-10-CM | POA: Diagnosis not present

## 2024-05-17 DIAGNOSIS — H9042 Sensorineural hearing loss, unilateral, left ear, with unrestricted hearing on the contralateral side: Secondary | ICD-10-CM | POA: Diagnosis not present

## 2024-06-02 DIAGNOSIS — K5903 Drug induced constipation: Secondary | ICD-10-CM | POA: Diagnosis not present

## 2024-06-02 DIAGNOSIS — E65 Localized adiposity: Secondary | ICD-10-CM | POA: Diagnosis not present

## 2024-06-02 DIAGNOSIS — R7303 Prediabetes: Secondary | ICD-10-CM | POA: Diagnosis not present

## 2024-06-02 DIAGNOSIS — E559 Vitamin D deficiency, unspecified: Secondary | ICD-10-CM | POA: Diagnosis not present

## 2024-06-02 DIAGNOSIS — I10 Essential (primary) hypertension: Secondary | ICD-10-CM | POA: Diagnosis not present

## 2024-06-30 DIAGNOSIS — E65 Localized adiposity: Secondary | ICD-10-CM | POA: Diagnosis not present

## 2024-06-30 DIAGNOSIS — K5903 Drug induced constipation: Secondary | ICD-10-CM | POA: Diagnosis not present

## 2024-06-30 DIAGNOSIS — I10 Essential (primary) hypertension: Secondary | ICD-10-CM | POA: Diagnosis not present

## 2024-06-30 DIAGNOSIS — R7303 Prediabetes: Secondary | ICD-10-CM | POA: Diagnosis not present

## 2024-06-30 DIAGNOSIS — E559 Vitamin D deficiency, unspecified: Secondary | ICD-10-CM | POA: Diagnosis not present

## 2024-07-05 DIAGNOSIS — D2361 Other benign neoplasm of skin of right upper limb, including shoulder: Secondary | ICD-10-CM | POA: Diagnosis not present

## 2024-07-05 DIAGNOSIS — D225 Melanocytic nevi of trunk: Secondary | ICD-10-CM | POA: Diagnosis not present

## 2024-07-05 DIAGNOSIS — L821 Other seborrheic keratosis: Secondary | ICD-10-CM | POA: Diagnosis not present

## 2024-07-05 DIAGNOSIS — D2272 Melanocytic nevi of left lower limb, including hip: Secondary | ICD-10-CM | POA: Diagnosis not present

## 2024-07-05 DIAGNOSIS — Z85828 Personal history of other malignant neoplasm of skin: Secondary | ICD-10-CM | POA: Diagnosis not present

## 2024-07-05 DIAGNOSIS — L814 Other melanin hyperpigmentation: Secondary | ICD-10-CM | POA: Diagnosis not present

## 2024-07-05 DIAGNOSIS — L570 Actinic keratosis: Secondary | ICD-10-CM | POA: Diagnosis not present

## 2024-07-28 DIAGNOSIS — K5903 Drug induced constipation: Secondary | ICD-10-CM | POA: Diagnosis not present

## 2024-07-28 DIAGNOSIS — R7303 Prediabetes: Secondary | ICD-10-CM | POA: Diagnosis not present

## 2024-07-28 DIAGNOSIS — I10 Essential (primary) hypertension: Secondary | ICD-10-CM | POA: Diagnosis not present

## 2024-07-28 DIAGNOSIS — E65 Localized adiposity: Secondary | ICD-10-CM | POA: Diagnosis not present

## 2024-07-28 DIAGNOSIS — E559 Vitamin D deficiency, unspecified: Secondary | ICD-10-CM | POA: Diagnosis not present

## 2024-09-17 ENCOUNTER — Ambulatory Visit: Payer: Self-pay

## 2024-11-18 ENCOUNTER — Other Ambulatory Visit (HOSPITAL_BASED_OUTPATIENT_CLINIC_OR_DEPARTMENT_OTHER): Payer: Self-pay | Admitting: Family Medicine

## 2024-11-18 DIAGNOSIS — Z1231 Encounter for screening mammogram for malignant neoplasm of breast: Secondary | ICD-10-CM

## 2024-11-19 NOTE — Progress Notes (Signed)
 Andrea [name]                                          MRN: [id number]   11/19/2024   The [facility] Quality Team Specialist reviewed this patient medical record for the purposes of chart review for care gap closure. The following were reviewed: chart review for care gap closure-glycemic status assessment.    [facility] Quality Team

## 2024-12-16 ENCOUNTER — Ambulatory Visit (HOSPITAL_BASED_OUTPATIENT_CLINIC_OR_DEPARTMENT_OTHER)
